# Patient Record
Sex: Male | Born: 1975 | Race: White | Hispanic: No | Marital: Married | Smoking: Never smoker
Health system: Southern US, Community
[De-identification: ages and names within clinical notes are randomized; demographics above are authoritative.]

## PROBLEM LIST (undated history)

## (undated) DIAGNOSIS — E669 Obesity, unspecified: Secondary | ICD-10-CM

## (undated) DIAGNOSIS — I10 Essential (primary) hypertension: Secondary | ICD-10-CM

## (undated) HISTORY — DX: Obesity, unspecified: E66.9

## (undated) HISTORY — PX: OTHER SURGICAL HISTORY: SHX169

## (undated) HISTORY — DX: Essential (primary) hypertension: I10

---

## 2001-02-13 ENCOUNTER — Emergency Department (HOSPITAL_COMMUNITY): Admission: EM | Admit: 2001-02-13 | Discharge: 2001-02-13 | Payer: Self-pay | Admitting: Emergency Medicine

## 2001-05-18 ENCOUNTER — Emergency Department (HOSPITAL_COMMUNITY): Admission: EM | Admit: 2001-05-18 | Discharge: 2001-05-18 | Payer: Self-pay | Admitting: Emergency Medicine

## 2014-01-14 ENCOUNTER — Other Ambulatory Visit: Payer: Self-pay

## 2014-01-25 ENCOUNTER — Telehealth: Payer: Self-pay | Admitting: Nurse Practitioner

## 2014-01-25 NOTE — Telephone Encounter (Signed)
Notified pt's wife that he needs to be seen appt scheduled

## 2014-01-30 ENCOUNTER — Ambulatory Visit: Payer: Self-pay | Admitting: Family

## 2014-02-13 ENCOUNTER — Ambulatory Visit: Payer: Self-pay | Admitting: Physician Assistant

## 2014-04-09 ENCOUNTER — Ambulatory Visit: Payer: Self-pay | Admitting: Family Medicine

## 2014-04-15 ENCOUNTER — Telehealth: Payer: Self-pay | Admitting: Family Medicine

## 2014-04-15 ENCOUNTER — Ambulatory Visit (INDEPENDENT_AMBULATORY_CARE_PROVIDER_SITE_OTHER): Payer: PRIVATE HEALTH INSURANCE | Admitting: Family Medicine

## 2014-04-15 ENCOUNTER — Encounter (INDEPENDENT_AMBULATORY_CARE_PROVIDER_SITE_OTHER): Payer: Self-pay

## 2014-04-15 VITALS — BP 181/103 | HR 80 | Temp 98.3°F | Ht 73.0 in | Wt 339.0 lb

## 2014-04-15 DIAGNOSIS — I10 Essential (primary) hypertension: Secondary | ICD-10-CM

## 2014-04-15 LAB — POCT CBC
Granulocyte percent: 67.3 %G (ref 37–80)
HCT, POC: 48.6 % (ref 43.5–53.7)
Hemoglobin: 16.5 g/dL (ref 14.1–18.1)
Lymph, poc: 1.9 (ref 0.6–3.4)
MCH, POC: 29.2 pg (ref 27–31.2)
MCHC: 33.9 g/dL (ref 31.8–35.4)
MCV: 86 fL (ref 80–97)
MPV: 7.9 fL (ref 0–99.8)
POC Granulocyte: 4.6 (ref 2–6.9)
POC LYMPH PERCENT: 28.3 %L (ref 10–50)
Platelet Count, POC: 147 10*3/uL (ref 142–424)
RBC: 5.7 M/uL (ref 4.69–6.13)
RDW, POC: 13.4 %
WBC: 6.8 10*3/uL (ref 4.6–10.2)

## 2014-04-15 MED ORDER — AMLODIPINE BESY-BENAZEPRIL HCL 10-40 MG PO CAPS: 1.0000 | ORAL_CAPSULE | Freq: Every day | ORAL | Status: DC

## 2014-04-16 LAB — THYROID PANEL WITH TSH
Free Thyroxine Index: 2 (ref 1.2–4.9)
T3 Uptake Ratio: 26 % (ref 24–39)
T4, Total: 7.7 ug/dL (ref 4.5–12.0)
TSH: 1.93 u[IU]/mL (ref 0.450–4.500)

## 2014-04-16 LAB — CMP14+EGFR
ALT: 29 IU/L (ref 0–44)
AST: 22 IU/L (ref 0–40)
Albumin/Globulin Ratio: 1.8 (ref 1.1–2.5)
Albumin: 4.6 g/dL (ref 3.5–5.5)
Alkaline Phosphatase: 93 IU/L (ref 39–117)
BUN/Creatinine Ratio: 14 (ref 8–19)
BUN: 18 mg/dL (ref 6–20)
CO2: 24 mmol/L (ref 18–29)
Calcium: 9.3 mg/dL (ref 8.7–10.2)
Chloride: 102 mmol/L (ref 97–108)
Creatinine, Ser: 1.27 mg/dL (ref 0.76–1.27)
GFR calc Af Amer: 82 mL/min/{1.73_m2} (ref >59)
GFR calc non Af Amer: 71 mL/min/{1.73_m2} (ref >59)
Globulin, Total: 2.5 g/dL (ref 1.5–4.5)
Glucose: 91 mg/dL (ref 65–99)
Potassium: 4.3 mmol/L (ref 3.5–5.2)
Sodium: 141 mmol/L (ref 134–144)
Total Bilirubin: 0.8 mg/dL (ref 0.0–1.2)
Total Protein: 7.1 g/dL (ref 6.0–8.5)

## 2014-04-16 LAB — LIPID PANEL
Chol/HDL Ratio: 5.5 ratio units — ABNORMAL HIGH (ref 0.0–5.0)
Cholesterol, Total: 166 mg/dL (ref 100–199)
HDL: 30 mg/dL — ABNORMAL LOW (ref >39)
Triglycerides: 402 mg/dL — ABNORMAL HIGH (ref 0–149)

## 2014-04-16 NOTE — Telephone Encounter (Signed)
done

## 2014-04-17 NOTE — Progress Notes (Signed)
   Subjective:    Patient ID: Marlos Carmen, male    DOB: 12-08-1975, 38 y.o.   MRN: [id number]  HPI This 38 y.o. male presents for evaluation of hypertension.  He has run out of his bp medicine and needs a refill.   Review of Systems No chest pain, SOB, HA, dizziness, vision change, N/V, diarrhea, constipation, dysuria, urinary urgency or frequency, myalgias, arthralgias or rash.     Objective:   Physical Exam  Vital signs noted  Well developed well nourished male.  HEENT - Head atraumatic Normocephalic                Eyes - PERRLA, Conjuctiva - clear Sclera- Clear EOMI                Ears - EAC's Wnl TM's Wnl Gross Hearing WNL                Nose - Nares patent                 Throat - oropharanx wnl Respiratory - Lungs CTA bilateral Cardiac - RRR S1 and S2 without murmur GI - Abdomen soft Nontender and bowel sounds active x 4 Extremities - No edema. Neuro - Grossly intact.      Assessment & Plan:  Essential hypertension, benign - Plan: amLODipine-benazepril (LOTREL) 10-40 MG per capsule, POCT CBC, CMP14+EGFR, Thyroid Panel With TSH, Lipid panel  Lysbeth Penner FNP

## 2014-05-16 ENCOUNTER — Ambulatory Visit (INDEPENDENT_AMBULATORY_CARE_PROVIDER_SITE_OTHER): Payer: PRIVATE HEALTH INSURANCE | Admitting: Family Medicine

## 2014-05-16 ENCOUNTER — Encounter: Payer: Self-pay | Admitting: Family Medicine

## 2014-05-16 VITALS — BP 132/80 | HR 61 | Temp 97.4°F | Ht 73.0 in | Wt 335.0 lb

## 2014-05-16 DIAGNOSIS — E785 Hyperlipidemia, unspecified: Secondary | ICD-10-CM

## 2014-05-16 DIAGNOSIS — I10 Essential (primary) hypertension: Secondary | ICD-10-CM

## 2014-05-16 NOTE — Progress Notes (Signed)
   Subjective:    Patient ID: Donald Eaton, male    DOB: 26-Apr-1976, 38 y.o.   MRN: [id number]  HPI Patient is here for follow up on his HTN.  He is tolerating his bp medicine.  He has elevated trigs on last lipid panel and is going to repeat fasting lipid panel.   Review of Systems    No chest pain, SOB, HA, dizziness, vision change, N/V, diarrhea, constipation, dysuria, urinary urgency or frequency, myalgias, arthralgias or rash.  Objective:   Physical Exam Vital signs noted  Well developed well nourished male.  HEENT - Head atraumatic Normocephalic                Eyes - PERRLA, Conjuctiva - clear Sclera- Clear EOMI                Ears - EAC's Wnl TM's Wnl Gross Hearing WNL                Throat - oropharanx wnl Respiratory - Lungs CTA bilateral Cardiac - RRR S1 and S2 without murmur GI - Abdomen soft Nontender and bowel sounds active x 4 Extremities - No edema. Neuro - Grossly intact.       Assessment & Plan:  Other and unspecified hyperlipidemia - Plan: Lipid panel  Essential hypertension, benign - Plan: BMP8+EGFR  Follow up in 6 months  Lysbeth Penner FNP

## 2014-05-17 LAB — BMP8+EGFR
BUN/Creatinine Ratio: 13 (ref 8–19)
BUN: 16 mg/dL (ref 6–20)
CO2: 24 mmol/L (ref 18–29)
Calcium: 9.5 mg/dL (ref 8.7–10.2)
Chloride: 102 mmol/L (ref 97–108)
Creatinine, Ser: 1.21 mg/dL (ref 0.76–1.27)
GFR calc Af Amer: 87 mL/min/{1.73_m2} (ref >59)
GFR calc non Af Amer: 75 mL/min/{1.73_m2} (ref >59)
Glucose: 94 mg/dL (ref 65–99)
Potassium: 4.9 mmol/L (ref 3.5–5.2)
Sodium: 139 mmol/L (ref 134–144)

## 2014-05-17 LAB — LIPID PANEL
Chol/HDL Ratio: 4.4 ratio units (ref 0.0–5.0)
Cholesterol, Total: 158 mg/dL (ref 100–199)
HDL: 36 mg/dL — ABNORMAL LOW (ref >39)
LDL Calculated: 89 mg/dL (ref 0–99)
Triglycerides: 163 mg/dL — ABNORMAL HIGH (ref 0–149)
VLDL Cholesterol Cal: 33 mg/dL (ref 5–40)

## 2014-08-19 ENCOUNTER — Ambulatory Visit: Payer: PRIVATE HEALTH INSURANCE | Admitting: Family Medicine

## 2015-04-14 ENCOUNTER — Ambulatory Visit (INDEPENDENT_AMBULATORY_CARE_PROVIDER_SITE_OTHER): Payer: PRIVATE HEALTH INSURANCE | Admitting: Nurse Practitioner

## 2015-04-14 ENCOUNTER — Encounter: Payer: Self-pay | Admitting: Nurse Practitioner

## 2015-04-14 VITALS — BP 143/89 | HR 75 | Temp 97.2°F | Ht 73.0 in | Wt 314.0 lb

## 2015-04-14 DIAGNOSIS — L918 Other hypertrophic disorders of the skin: Secondary | ICD-10-CM

## 2015-04-14 NOTE — Progress Notes (Signed)
   Subjective:    Patient ID: Donald Eaton, male    DOB: 16-Jan-1976, 39 y.o.   MRN: [id number]  HPI Patien tin c/o multiple skin tags in axillary area and on neck- wants removed.    Review of Systems  All other systems reviewed and are negative.      Objective:   Physical Exam  Constitutional: He appears well-developed and well-nourished.  Cardiovascular: Normal rate, regular rhythm and normal heart sounds.   Pulmonary/Chest: Effort normal and breath sounds normal.  Skin: Skin is warm.  Multiple skin tags left axillia and neck  Psychiatric: He has a normal mood and affect. His behavior is normal. Judgment and thought content normal.   BP 143/89 mmHg  Pulse 75  Temp(Src) 97.2 F (36.2 C) (Oral)  Ht  (1.854 m)  Wt 314 lb (142.429 kg)  BMI 41.44 kg/m2  Procedure:  Betadine prep  Stevens scissors and pick ups for removal- 45 total  Patient tolerated well.       Assessment & Plan:   1. Cutaneous skin tags    Watch for signs of infection RTO if want more removed  Mary-Margaret Daphine Deutscher, FNP

## 2015-10-27 ENCOUNTER — Ambulatory Visit (INDEPENDENT_AMBULATORY_CARE_PROVIDER_SITE_OTHER): Payer: PRIVATE HEALTH INSURANCE | Admitting: Family Medicine

## 2015-10-27 ENCOUNTER — Encounter: Payer: Self-pay | Admitting: Family Medicine

## 2015-10-27 ENCOUNTER — Encounter: Payer: Self-pay | Admitting: *Deleted

## 2015-10-27 VITALS — BP 174/105 | HR 67 | Temp 97.1°F | Ht 73.0 in | Wt 334.4 lb

## 2015-10-27 DIAGNOSIS — R519 Headache, unspecified: Secondary | ICD-10-CM

## 2015-10-27 DIAGNOSIS — R110 Nausea: Secondary | ICD-10-CM | POA: Diagnosis not present

## 2015-10-27 DIAGNOSIS — R51 Headache: Secondary | ICD-10-CM | POA: Diagnosis not present

## 2015-10-27 DIAGNOSIS — I10 Essential (primary) hypertension: Secondary | ICD-10-CM

## 2015-10-27 MED ORDER — AMLODIPINE BESY-BENAZEPRIL HCL 10-40 MG PO CAPS: 1.0000 | ORAL_CAPSULE | Freq: Every day | ORAL | Status: DC

## 2015-10-27 MED ORDER — ONDANSETRON 8 MG PO TBDP: 8.0000 mg | ORAL_TABLET | Freq: Four times a day (QID) | ORAL | Status: DC | PRN

## 2015-10-27 NOTE — Progress Notes (Signed)
Subjective:  Patient ID: Donald Eaton, male    DOB: 01/28/1976  Age: 40 y.o. MRN: [id number]  CC: Hypertension   HPI Donald DachMichael [place] presents for severe HA awakened him at 4 AM. Ermalene [place] aleve. Went back to bed. Awoke at 10 AM still had HA. Came to office. Pain now resolved at presentation. Remains nauseated. No vomiting. HA unusual for him. Located at left frontal to temple area. 7-8/10. Throbbing. Pt. Had lost weight so beginning on Jan 1 started decreasing frequency of BP med, lotrel. Last dose was 2-3 weeks ago. Took one this morning due to sx.    History Casimiro NeedleMichael has no past medical history on file.   He has no past surgical history on file.   His family history is not on file.He reports that he has never smoked. He does not have any smokeless tobacco history on file. He reports that he does not drink alcohol or use illicit drugs.    ROS Review of Systems  Constitutional: Negative for fever, chills, diaphoresis and unexpected weight change.  HENT: Negative for congestion, hearing loss, rhinorrhea and sore throat.   Eyes: Negative for visual disturbance.  Respiratory: Negative for cough and shortness of breath.   Cardiovascular: Negative for chest pain.  Gastrointestinal: Positive for nausea. Negative for vomiting, abdominal pain, diarrhea and constipation.  Genitourinary: Negative for dysuria and flank pain.  Musculoskeletal: Negative for joint swelling and arthralgias.  Skin: Negative for rash.  Neurological: Positive for headaches. Negative for dizziness.  Psychiatric/Behavioral: Negative for sleep disturbance and dysphoric mood.    Objective:  BP 174/105 mmHg  Pulse 67  Temp(Src) 97.1 F (36.2 C) (Oral)  Ht 6\' 1"  (1.854 m)  Wt 334 lb 6 oz (151.672 kg)  BMI 44.13 kg/m2  BP Readings from Last 3 Encounters:  10/27/15 174/105  04/14/15 143/89  05/16/14 132/80    Wt Readings from Last 3 Encounters:  10/27/15 334 lb 6 oz (151.672 kg)  04/14/15 314 lb (142.429  kg)  05/16/14 335 lb (151.955 kg)     Physical Exam  Constitutional: He is oriented to person, place, and time. He appears well-developed and well-nourished. No distress.  HENT:  Head: Normocephalic and atraumatic.  Right Ear: External ear normal.  Left Ear: External ear normal.  Nose: Nose normal.  Mouth/Throat: Oropharynx is clear and moist.  Eyes: Conjunctivae and EOM are normal. Pupils are equal, round, and reactive to light.  Neck: Normal range of motion. Neck supple. No thyromegaly present.  Cardiovascular: Normal rate, regular rhythm and normal heart sounds.   No murmur heard. Pulmonary/Chest: Effort normal and breath sounds normal. No respiratory distress. He has no wheezes. He has no rales.  Abdominal: Soft. Bowel sounds are normal. He exhibits no distension. There is no tenderness.  Lymphadenopathy:    He has no cervical adenopathy.  Neurological: He is alert and oriented to person, place, and time. He has normal reflexes. No cranial nerve deficit. He exhibits normal muscle tone. Coordination normal.  Skin: Skin is warm and dry.  Psychiatric: He has a normal mood and affect. His behavior is normal. Judgment and thought content normal.     Lab Results  Component Value Date   WBC 6.8 04/15/2014   HGB 16.5 04/15/2014   HCT 48.6 04/15/2014   GLUCOSE 94 05/16/2014   CHOL 158 05/16/2014   TRIG 163* 05/16/2014   HDL 36* 05/16/2014   LDLCALC 89 05/16/2014   ALT 29 04/15/2014   AST 22 04/15/2014   NA 139  05/16/2014   K 4.9 05/16/2014   CL 102 05/16/2014   CREATININE 1.21 05/16/2014   BUN 16 05/16/2014   CO2 24 05/16/2014   TSH 1.930 04/15/2014    No results found.  Assessment & Plan:   There are no diagnoses linked to this encounter.  Discussed [employer] Green low carb approach to weight loss  I am having Mr. Wacker maintain his amLODipine-benazepril.  No orders of the defined types were placed in this encounter.     Follow-up: No Follow-up on  file.  Mechele Claude, M.D.

## 2015-10-27 NOTE — Patient Instructions (Signed)
Try putting the "Lose It" app on your phone to help you with calorie counting, food choices and exercise

## 2015-10-28 LAB — CMP14+EGFR
ALBUMIN: 4.6 g/dL (ref 3.5–5.5)
ALT: 24 IU/L (ref 0–44)
AST: 22 IU/L (ref 0–40)
Albumin/Globulin Ratio: 1.7 (ref 1.1–2.5)
Alkaline Phosphatase: 59 IU/L (ref 39–117)
BUN / CREAT RATIO: 17 (ref 9–20)
BUN: 16 mg/dL (ref 6–24)
Bilirubin Total: 1 mg/dL (ref 0.0–1.2)
CALCIUM: 9.1 mg/dL (ref 8.7–10.2)
CO2: 20 mmol/L (ref 18–29)
CREATININE: 0.95 mg/dL (ref 0.76–1.27)
Chloride: 102 mmol/L (ref 96–106)
GFR, EST AFRICAN AMERICAN: 115 mL/min/{1.73_m2} (ref >59)
GFR, EST NON AFRICAN AMERICAN: 100 mL/min/{1.73_m2} (ref >59)
GLOBULIN, TOTAL: 2.7 g/dL (ref 1.5–4.5)
Glucose: 108 mg/dL — ABNORMAL HIGH (ref 65–99)
Potassium: 4.5 mmol/L (ref 3.5–5.2)
Sodium: 137 mmol/L (ref 134–144)
TOTAL PROTEIN: 7.3 g/dL (ref 6.0–8.5)

## 2015-10-28 LAB — CBC WITH DIFFERENTIAL/PLATELET
BASOS ABS: 0 10*3/uL (ref 0.0–0.2)
Basos: 0 %
EOS (ABSOLUTE): 0.1 10*3/uL (ref 0.0–0.4)
EOS: 1 %
HEMATOCRIT: 50.2 % (ref 37.5–51.0)
HEMOGLOBIN: 17.2 g/dL (ref 12.6–17.7)
IMMATURE GRANS (ABS): 0 10*3/uL (ref 0.0–0.1)
IMMATURE GRANULOCYTES: 0 %
LYMPHS ABS: 1.1 10*3/uL (ref 0.7–3.1)
LYMPHS: 19 %
MCH: 29 pg (ref 26.6–33.0)
MCHC: 34.3 g/dL (ref 31.5–35.7)
MCV: 85 fL (ref 79–97)
MONOCYTES: 6 %
Monocytes Absolute: 0.4 10*3/uL (ref 0.1–0.9)
Neutrophils Absolute: 4.4 10*3/uL (ref 1.4–7.0)
Neutrophils: 74 %
Platelets: 149 10*3/uL — ABNORMAL LOW (ref 150–379)
RBC: 5.94 x10E6/uL — AB (ref 4.14–5.80)
RDW: 13.6 % (ref 12.3–15.4)
WBC: 5.9 10*3/uL (ref 3.4–10.8)

## 2015-10-28 LAB — SEDIMENTATION RATE: Sed Rate: 2 mm/hr (ref 0–15)

## 2015-11-13 ENCOUNTER — Ambulatory Visit (INDEPENDENT_AMBULATORY_CARE_PROVIDER_SITE_OTHER): Payer: PRIVATE HEALTH INSURANCE | Admitting: Family Medicine

## 2015-11-13 VITALS — Temp 97.0°F | Ht 73.0 in | Wt 326.0 lb

## 2015-11-13 DIAGNOSIS — I10 Essential (primary) hypertension: Secondary | ICD-10-CM | POA: Diagnosis not present

## 2015-11-13 NOTE — Patient Instructions (Signed)
Congratulations on a great start on your weight loss plan! Keep going!

## 2015-11-13 NOTE — Progress Notes (Signed)
Subjective:  Patient ID: Donald Eaton, male    DOB: May 13, 1976  Age: 40 y.o. MRN: [id number]  CC: Hypertension   HPI Donald Eaton presents for  follow-up of hypertension. Patient has no history of headache chest pain or shortness of breath or recent cough. Patient also denies symptoms of TIA such as numbness weakness lateralizing. Patient denies side effects from medication. States taking it regularly. Working on [employer] and green diet. Weight down 8 lb in 18 days.   History [name] has no past medical history on file.   He has no past surgical history on file.   His family history is not on file.He reports that he has never smoked. He does not have any smokeless tobacco history on file. He reports that he does not drink alcohol or use illicit drugs.  Current Outpatient Prescriptions on File Prior to Visit  Medication Sig Dispense Refill  . amLODipine-benazepril (LOTREL) 10-40 MG capsule Take 1 capsule by mouth daily. 90 capsule 3  . ondansetron (ZOFRAN-ODT) 8 MG disintegrating tablet Take 1 tablet (8 mg total) by mouth every 6 (six) hours as needed for nausea or vomiting. (Patient not taking: Reported on 11/13/2015) 20 tablet 1   No current facility-administered medications on file prior to visit.    ROS Review of Systems  Constitutional: Negative for fever, chills and diaphoresis.  HENT: Negative for rhinorrhea and sore throat.   Respiratory: Negative for cough and shortness of breath.   Cardiovascular: Negative for chest pain.  Gastrointestinal: Negative for abdominal pain.  Musculoskeletal: Negative for myalgias and arthralgias.  Skin: Negative for rash.  Neurological: Negative for weakness and headaches.    Objective:  Temp(Src) 97 F (36.1 C) (Oral)  Ht 6' 1"  (1.854 m)  Wt 326 lb (147.873 kg)  BMI 43.02 kg/m2  BP Readings from Last 3 Encounters:  10/27/15 174/105  04/14/15 143/89  05/16/14 132/80    Wt Readings from Last 3 Encounters:  11/13/15 326 lb  (147.873 kg)  10/27/15 334 lb 6 oz (151.672 kg)  04/14/15 314 lb (142.429 kg)     Physical Exam  Constitutional: He is oriented to person, place, and time. He appears well-developed and well-nourished.  HENT:  Head: Normocephalic and atraumatic.  Right Ear: External ear normal.  Left Ear: External ear normal.  Mouth/Throat: No oropharyngeal exudate or posterior oropharyngeal erythema.  Eyes: Pupils are equal, round, and reactive to light.  Neck: Normal range of motion. Neck supple.  Cardiovascular: Normal rate and regular rhythm.   No murmur heard. Pulmonary/Chest: Breath sounds normal. No respiratory distress.  Abdominal: Bowel sounds are normal.  Genitourinary: Penis normal.  Neurological: He is alert and oriented to person, place, and time.  Skin: Skin is warm.  Psychiatric: He has a normal mood and affect.  Vitals reviewed.    Lab Results  Component Value Date   WBC 5.9 10/27/2015   HGB 16.5 04/15/2014   HCT 50.2 10/27/2015   PLT 149* 10/27/2015   GLUCOSE 108* 10/27/2015   CHOL 158 05/16/2014   TRIG 163* 05/16/2014   HDL 36* 05/16/2014   LDLCALC 89 05/16/2014   ALT 24 10/27/2015   AST 22 10/27/2015   NA 137 10/27/2015   K 4.5 10/27/2015   CL 102 10/27/2015   CREATININE 0.95 10/27/2015   BUN 16 10/27/2015   CO2 20 10/27/2015   TSH 1.930 04/15/2014    No results found.  Assessment & Plan:   Leslee was seen today for hypertension.  Diagnoses and all  orders for this visit:  Essential hypertension, benign -     CBC with Differential/Platelet -     CMP14+EGFR -     Lipid panel   I am having Mr. Barthelemy maintain his amLODipine-benazepril and ondansetron.  No orders of the defined types were placed in this encounter.    Congratulations on a great start on your weight loss plan! Keep going!  Follow-up: Return in about 6 months (around 05/15/2016) for hypertension, weight loss.  Claretta Fraise, M.D.

## 2015-11-14 LAB — CMP14+EGFR
ALT: 26 IU/L (ref 0–44)
AST: 25 IU/L (ref 0–40)
Albumin/Globulin Ratio: 1.7 (ref 1.2–2.2)
Albumin: 4.3 g/dL (ref 3.5–5.5)
Alkaline Phosphatase: 57 IU/L (ref 39–117)
BUN/Creatinine Ratio: 17 (ref 9–20)
BUN: 18 mg/dL (ref 6–24)
Bilirubin Total: 0.7 mg/dL (ref 0.0–1.2)
CALCIUM: 8.9 mg/dL (ref 8.7–10.2)
CO2: 21 mmol/L (ref 18–29)
CREATININE: 1.07 mg/dL (ref 0.76–1.27)
Chloride: 103 mmol/L (ref 96–106)
GFR, EST AFRICAN AMERICAN: 100 mL/min/{1.73_m2} (ref >59)
GFR, EST NON AFRICAN AMERICAN: 86 mL/min/{1.73_m2} (ref >59)
Globulin, Total: 2.5 g/dL (ref 1.5–4.5)
Glucose: 115 mg/dL — ABNORMAL HIGH (ref 65–99)
POTASSIUM: 4.8 mmol/L (ref 3.5–5.2)
Sodium: 139 mmol/L (ref 134–144)
TOTAL PROTEIN: 6.8 g/dL (ref 6.0–8.5)

## 2015-11-14 LAB — CBC WITH DIFFERENTIAL/PLATELET
BASOS ABS: 0 10*3/uL (ref 0.0–0.2)
Basos: 0 %
EOS (ABSOLUTE): 0.1 10*3/uL (ref 0.0–0.4)
Eos: 2 %
HEMATOCRIT: 46.9 % (ref 37.5–51.0)
Hemoglobin: 15.8 g/dL (ref 12.6–17.7)
IMMATURE GRANULOCYTES: 0 %
Immature Grans (Abs): 0 10*3/uL (ref 0.0–0.1)
LYMPHS: 39 %
Lymphocytes Absolute: 1.4 10*3/uL (ref 0.7–3.1)
MCH: 29 pg (ref 26.6–33.0)
MCHC: 33.7 g/dL (ref 31.5–35.7)
MCV: 86 fL (ref 79–97)
Monocytes Absolute: 0.4 10*3/uL (ref 0.1–0.9)
Monocytes: 11 %
NEUTROS PCT: 48 %
Neutrophils Absolute: 1.7 10*3/uL (ref 1.4–7.0)
PLATELETS: 153 10*3/uL (ref 150–379)
RBC: 5.44 x10E6/uL (ref 4.14–5.80)
RDW: 13.2 % (ref 12.3–15.4)
WBC: 3.6 10*3/uL (ref 3.4–10.8)

## 2015-11-14 LAB — LIPID PANEL
CHOL/HDL RATIO: 3.6 ratio (ref 0.0–5.0)
Cholesterol, Total: 152 mg/dL (ref 100–199)
HDL: 42 mg/dL (ref >39)
LDL CALC: 95 mg/dL (ref 0–99)
TRIGLYCERIDES: 75 mg/dL (ref 0–149)
VLDL CHOLESTEROL CAL: 15 mg/dL (ref 5–40)

## 2016-05-20 ENCOUNTER — Ambulatory Visit: Payer: PRIVATE HEALTH INSURANCE | Admitting: Family Medicine

## 2016-06-21 ENCOUNTER — Ambulatory Visit: Payer: PRIVATE HEALTH INSURANCE | Admitting: Family Medicine

## 2016-11-14 ENCOUNTER — Encounter: Payer: Self-pay | Admitting: Family Medicine

## 2016-11-14 ENCOUNTER — Ambulatory Visit (INDEPENDENT_AMBULATORY_CARE_PROVIDER_SITE_OTHER): Payer: PRIVATE HEALTH INSURANCE | Admitting: Family Medicine

## 2016-11-14 VITALS — BP 123/76 | HR 57 | Temp 97.1°F | Ht 73.0 in | Wt 317.0 lb

## 2016-11-14 DIAGNOSIS — I10 Essential (primary) hypertension: Secondary | ICD-10-CM

## 2016-11-14 NOTE — Progress Notes (Signed)
Subjective:  Patient ID: Donald Eaton, male    DOB: 13-Apr-1976  Age: 41 y.o. MRN: [id number]  CC: Hypertension (pt here today for routine follow up on his HTN, no other concerns voiced.)   HPI Donald Eaton presents for  follow-up of hypertension. Patient has no history of headache chest pain or shortness of breath or recent cough. Patient also denies symptoms of TIA such as numbness weakness lateralizing. Patient checks  blood pressure at home and has not had any elevated readings recently. Patient denies side effects from medication. States taking it regularly. Working on weight loss. Down 26 lb since his high wt. 3 mos ago. Using weights, walking. Limiting carbs to 20 gm / day. Increased greens and lean meats.  History Donald Eaton has no past medical history on file.   He has no past surgical history on file.   His family history is not on file.He reports that he has never smoked. He has never used smokeless tobacco. He reports that he does not drink alcohol or use drugs.  Current Outpatient Prescriptions on File Prior to Visit  Medication Sig Dispense Refill  . amLODipine-benazepril (LOTREL) 10-40 MG capsule Take 1 capsule by mouth daily. 90 capsule 3   No current facility-administered medications on file prior to visit.     ROS Review of Systems  Constitutional: Negative for chills, diaphoresis, fever and unexpected weight change.  HENT: Negative for congestion, hearing loss, rhinorrhea and sore throat.   Eyes: Negative for visual disturbance.  Respiratory: Negative for cough and shortness of breath.   Cardiovascular: Negative for chest pain.  Gastrointestinal: Negative for abdominal pain, constipation and diarrhea.  Genitourinary: Negative for dysuria and flank pain.  Musculoskeletal: Negative for arthralgias and joint swelling.  Skin: Negative for rash.  Neurological: Negative for dizziness and headaches.  Psychiatric/Behavioral: Negative for dysphoric mood and sleep  disturbance.    Objective:  BP (!) 150/84   Pulse (!) 57   Temp 97.1 F (36.2 C) (Oral)   Ht 6\' 1"  (1.854 m)   Wt (!) 317 lb (143.8 kg)   BMI 41.82 kg/m   BP Readings from Last 3 Encounters:  11/14/16 (!) 150/84  10/27/15 (!) 174/105  04/14/15 (!) 143/89    Wt Readings from Last 3 Encounters:  11/14/16 (!) 317 lb (143.8 kg)  11/13/15 (!) 326 lb (147.9 kg)  10/27/15 (!) 334 lb 6 oz (151.7 kg)     Physical Exam  Constitutional: He is oriented to person, place, and time. He appears well-developed and well-nourished. No distress.  HENT:  Head: Normocephalic and atraumatic.  Right Ear: External ear normal.  Left Ear: External ear normal.  Nose: Nose normal.  Mouth/Throat: Oropharynx is clear and moist.  Eyes: Conjunctivae and EOM are normal. Pupils are equal, round, and reactive to light.  Neck: Normal range of motion. Neck supple. No thyromegaly present.  Cardiovascular: Normal rate, regular rhythm and normal heart sounds.   No murmur heard. Pulmonary/Chest: Effort normal and breath sounds normal. No respiratory distress. He has no wheezes. He has no rales.  Abdominal: Soft. Bowel sounds are normal. He exhibits no distension. There is no tenderness.  Lymphadenopathy:    He has no cervical adenopathy.  Neurological: He is alert and oriented to person, place, and time. He has normal reflexes.  Skin: Skin is warm and dry.  Psychiatric: He has a normal mood and affect. His behavior is normal. Judgment and thought content normal.     Lab Results  Component Value  Date   WBC 3.6 11/13/2015   HGB 16.5 04/15/2014   HCT 46.9 11/13/2015   PLT 153 11/13/2015   GLUCOSE 115 (H) 11/13/2015   CHOL 152 11/13/2015   TRIG 75 11/13/2015   HDL 42 11/13/2015   LDLCALC 95 11/13/2015   ALT 26 11/13/2015   AST 25 11/13/2015   NA 139 11/13/2015   K 4.8 11/13/2015   CL 103 11/13/2015   CREATININE 1.07 11/13/2015   BUN 18 11/13/2015   CO2 21 11/13/2015   TSH 1.930 04/15/2014     No results found.  Assessment & Plan:   Donald NeedleMichael was seen today for hypertension.  Diagnoses and all orders for this visit:  Essential hypertension, benign   I have discontinued Mr. Shular's ondansetron. I am also having him maintain his amLODipine-benazepril.  No orders of the defined types were placed in this encounter.     Follow-up: Return in about 6 months (around 05/17/2017) for CPE, Wellness.  Mechele ClaudeWarren Gianni Mihalik, M.D.

## 2016-11-14 NOTE — Addendum Note (Signed)
Addended by: Margurite AuerbachOMPTON, KARLA G on: 11/14/2016 04:54 PM   Modules accepted: Orders

## 2016-11-15 ENCOUNTER — Other Ambulatory Visit: Payer: Self-pay | Admitting: *Deleted

## 2016-11-15 DIAGNOSIS — I10 Essential (primary) hypertension: Secondary | ICD-10-CM

## 2016-11-15 LAB — CBC WITH DIFFERENTIAL/PLATELET
BASOS: 0 %
Basophils Absolute: 0 10*3/uL (ref 0.0–0.2)
EOS (ABSOLUTE): 0.1 10*3/uL (ref 0.0–0.4)
Eos: 2 %
Hematocrit: 47.9 % (ref 37.5–51.0)
Hemoglobin: 15.6 g/dL (ref 13.0–17.7)
IMMATURE GRANULOCYTES: 0 %
Immature Grans (Abs): 0 10*3/uL (ref 0.0–0.1)
LYMPHS ABS: 1.5 10*3/uL (ref 0.7–3.1)
Lymphs: 39 %
MCH: 28.8 pg (ref 26.6–33.0)
MCHC: 32.6 g/dL (ref 31.5–35.7)
MCV: 89 fL (ref 79–97)
MONOS ABS: 0.4 10*3/uL (ref 0.1–0.9)
Monocytes: 10 %
NEUTROS PCT: 49 %
Neutrophils Absolute: 1.8 10*3/uL (ref 1.4–7.0)
PLATELETS: 135 10*3/uL — AB (ref 150–379)
RBC: 5.41 x10E6/uL (ref 4.14–5.80)
RDW: 13.4 % (ref 12.3–15.4)
WBC: 3.7 10*3/uL (ref 3.4–10.8)

## 2016-11-15 LAB — CMP14+EGFR
ALK PHOS: 65 IU/L (ref 39–117)
ALT: 18 IU/L (ref 0–44)
AST: 17 IU/L (ref 0–40)
Albumin/Globulin Ratio: 1.9 (ref 1.2–2.2)
Albumin: 4.7 g/dL (ref 3.5–5.5)
BILIRUBIN TOTAL: 0.8 mg/dL (ref 0.0–1.2)
BUN / CREAT RATIO: 13 (ref 9–20)
BUN: 16 mg/dL (ref 6–24)
CALCIUM: 9.2 mg/dL (ref 8.7–10.2)
CHLORIDE: 102 mmol/L (ref 96–106)
CO2: 22 mmol/L (ref 18–29)
Creatinine, Ser: 1.24 mg/dL (ref 0.76–1.27)
GFR calc Af Amer: 83 mL/min/{1.73_m2} (ref >59)
GFR calc non Af Amer: 72 mL/min/{1.73_m2} (ref >59)
Globulin, Total: 2.5 g/dL (ref 1.5–4.5)
Glucose: 92 mg/dL (ref 65–99)
Potassium: 4.7 mmol/L (ref 3.5–5.2)
Sodium: 142 mmol/L (ref 134–144)
Total Protein: 7.2 g/dL (ref 6.0–8.5)

## 2016-11-15 LAB — LIPID PANEL
CHOLESTEROL TOTAL: 179 mg/dL (ref 100–199)
Chol/HDL Ratio: 4.2 ratio units (ref 0.0–5.0)
HDL: 43 mg/dL (ref >39)
LDL Calculated: 118 mg/dL — ABNORMAL HIGH (ref 0–99)
Triglycerides: 88 mg/dL (ref 0–149)
VLDL CHOLESTEROL CAL: 18 mg/dL (ref 5–40)

## 2016-11-15 MED ORDER — AMLODIPINE BESY-BENAZEPRIL HCL 10-40 MG PO CAPS: 1.0000 | ORAL_CAPSULE | Freq: Every day | ORAL | 3 refills | Status: DC

## 2017-05-19 ENCOUNTER — Ambulatory Visit (INDEPENDENT_AMBULATORY_CARE_PROVIDER_SITE_OTHER): Payer: PRIVATE HEALTH INSURANCE | Admitting: Family Medicine

## 2017-05-19 ENCOUNTER — Encounter: Payer: Self-pay | Admitting: Family Medicine

## 2017-05-19 VITALS — BP 119/74 | HR 62 | Ht 73.0 in | Wt 284.0 lb

## 2017-05-19 DIAGNOSIS — Z0000 Encounter for general adult medical examination without abnormal findings: Secondary | ICD-10-CM | POA: Diagnosis not present

## 2017-05-19 DIAGNOSIS — I10 Essential (primary) hypertension: Secondary | ICD-10-CM

## 2017-05-19 DIAGNOSIS — L719 Rosacea, unspecified: Secondary | ICD-10-CM | POA: Insufficient documentation

## 2017-05-19 LAB — URINALYSIS
Bilirubin, UA: NEGATIVE
Glucose, UA: NEGATIVE
Ketones, UA: NEGATIVE
LEUKOCYTES UA: NEGATIVE
NITRITE UA: NEGATIVE
PH UA: 7.5 (ref 5.0–7.5)
Protein, UA: NEGATIVE
RBC, UA: NEGATIVE
Specific Gravity, UA: 1.01 (ref 1.005–1.030)
UUROB: 0.2 mg/dL (ref 0.2–1.0)

## 2017-05-19 MED ORDER — AMLODIPINE BESY-BENAZEPRIL HCL 10-40 MG PO CAPS: 1.0000 | ORAL_CAPSULE | Freq: Every day | ORAL | 3 refills | Status: DC

## 2017-05-19 MED ORDER — METRONIDAZOLE 1 % EX GEL: Freq: Every day | CUTANEOUS | 11 refills | Status: DC

## 2017-05-19 NOTE — Progress Notes (Signed)
Subjective:  Patient ID: Donald Eaton, male    DOB: [birth year]  Age: 41 y.o. MRN: [id number]  CC: Annual Exam (pt here today for CPE)   HPI Donald Eaton presents for Annual exam and  follow-up of hypertension. Patient has no history of headache chest pain or shortness of breath or recent cough. Patient .also denies symptoms of TIA such as focal numbness or weakness.  Patient denies side effects from medication. States taking it regularly. Pt. On diet, has lost 60 lbs over 1 year   History Donald Eaton has a past medical history of Hypertension and Obesity.   He has a past surgical history that includes none.   His family history includes Heart disease in his father.He reports that he has never smoked. He has never used smokeless tobacco. He reports that he does not drink alcohol or use drugs.  No current outpatient prescriptions on file prior to visit.   No current facility-administered medications on file prior to visit.     ROS Review of Systems  Constitutional: Negative for activity change, appetite change, chills, diaphoresis, fatigue, fever and unexpected weight change.  HENT: Negative for congestion, ear pain, hearing loss, postnasal drip, rhinorrhea, sore throat, tinnitus and trouble swallowing.   Eyes: Negative for photophobia, pain, discharge and redness.  Respiratory: Negative for apnea, cough, choking, chest tightness, shortness of breath, wheezing and stridor.   Cardiovascular: Negative for chest pain, palpitations and leg swelling.  Gastrointestinal: Negative for abdominal distention, abdominal pain, blood in stool, constipation, diarrhea, nausea and vomiting.  Endocrine: Negative for cold intolerance, heat intolerance, polydipsia, polyphagia and polyuria.  Genitourinary: Negative for difficulty urinating, dysuria, enuresis, flank pain, frequency, genital sores, hematuria and urgency.  Musculoskeletal: Negative for arthralgias and joint swelling.  Skin: Negative for  color change, rash and wound.  Allergic/Immunologic: Negative for immunocompromised state.  Neurological: Negative for dizziness, tremors, seizures, syncope, facial asymmetry, speech difficulty, weakness, light-headedness, numbness and headaches.  Hematological: Does not bruise/bleed easily.  Psychiatric/Behavioral: Negative for agitation, behavioral problems, confusion, decreased concentration, dysphoric mood, hallucinations, sleep disturbance and suicidal ideas. The patient is not nervous/anxious and is not hyperactive.     Objective:  BP 119/74   Pulse 62   Ht 6' 1"  (1.854 m)   Wt 284 lb (128.8 kg)   BMI 37.47 kg/m   BP Readings from Last 3 Encounters:  05/19/17 119/74  11/14/16 123/76  10/27/15 (!) 174/105    Wt Readings from Last 3 Encounters:  05/19/17 284 lb (128.8 kg)  11/14/16 (!) 317 lb (143.8 kg)  11/13/15 (!) 326 lb (147.9 kg)     Physical Exam  Constitutional: He is oriented to person, place, and time. He appears well-developed and well-nourished.  HENT:  Head: Normocephalic and atraumatic.  Mouth/Throat: Oropharynx is clear and moist.  Eyes: Pupils are equal, round, and reactive to light. EOM are normal.  Neck: Normal range of motion. No tracheal deviation present. No thyromegaly present.  Cardiovascular: Normal rate, regular rhythm and normal heart sounds.  Exam reveals no gallop and no friction rub.   No murmur heard. Pulmonary/Chest: Breath sounds normal. He has no wheezes. He has no rales.  Abdominal: Soft. He exhibits no mass. There is no tenderness.  Musculoskeletal: Normal range of motion. He exhibits no edema.  Neurological: He is alert and oriented to person, place, and time.  Skin: Skin is warm and dry. Rash (mulopapular erythema of cheeks, nose. Few telangectasias) noted.  Psychiatric: He has a normal mood and affect.  Assessment & Plan:   Donald Eaton was seen today for annual exam.  Diagnoses and all orders for this visit:  Well adult  exam -     CBC with Differential/Platelet -     CMP14+EGFR -     Lipid panel -     PSA, total and free -     Urinalysis  Essential hypertension, benign -     CMP14+EGFR -     Urinalysis -     amLODipine-benazepril (LOTREL) 10-40 MG capsule; Take 1 capsule by mouth daily.  Rosacea, acne -     metroNIDAZOLE (METROGEL) 1 % gel; Apply topically daily.   Allergies as of 05/19/2017      Reactions   Niaspan [niacin Er] Rash      Medication List       Accurate as of 05/19/17 10:15 PM. Always use your most recent med list.          amLODipine-benazepril 10-40 MG capsule Commonly known as:  LOTREL Take 1 capsule by mouth daily.   metroNIDAZOLE 1 % gel Commonly known as:  METROGEL Apply topically daily.            Discharge Care Instructions        Start     Ordered   05/19/17 0000  CBC with Differential/Platelet     05/19/17 1007   05/19/17 0000  CMP14+EGFR     05/19/17 1007   05/19/17 0000  Lipid panel     05/19/17 1007   05/19/17 0000  PSA, total and free     05/19/17 1007   05/19/17 0000  Urinalysis     05/19/17 1007   05/19/17 0000  metroNIDAZOLE (METROGEL) 1 % gel  Daily     05/19/17 1008   05/19/17 0000  amLODipine-benazepril (LOTREL) 10-40 MG capsule  Daily     05/19/17 1008      Meds ordered this encounter  Medications  . metroNIDAZOLE (METROGEL) 1 % gel    Sig: Apply topically daily.    Dispense:  60 g    Refill:  11  . amLODipine-benazepril (LOTREL) 10-40 MG capsule    Sig: Take 1 capsule by mouth daily.    Dispense:  90 capsule    Refill:  3      Follow-up: Return in about 6 months (around 11/16/2017).  Claretta Fraise, M.D.

## 2017-05-19 NOTE — Patient Instructions (Signed)
Wash face daily with Neutrogena "orange" acne wash.

## 2017-05-20 LAB — LIPID PANEL
CHOL/HDL RATIO: 3.7 ratio (ref 0.0–5.0)
Cholesterol, Total: 174 mg/dL (ref 100–199)
HDL: 47 mg/dL (ref >39)
LDL Calculated: 113 mg/dL — ABNORMAL HIGH (ref 0–99)
Triglycerides: 72 mg/dL (ref 0–149)
VLDL Cholesterol Cal: 14 mg/dL (ref 5–40)

## 2017-05-20 LAB — CBC WITH DIFFERENTIAL/PLATELET
Basophils Absolute: 0 10*3/uL (ref 0.0–0.2)
Basos: 0 %
EOS (ABSOLUTE): 0 10*3/uL (ref 0.0–0.4)
Eos: 1 %
Hematocrit: 43.7 % (ref 37.5–51.0)
Hemoglobin: 14.8 g/dL (ref 13.0–17.7)
IMMATURE GRANULOCYTES: 1 %
Immature Grans (Abs): 0 10*3/uL (ref 0.0–0.1)
Lymphocytes Absolute: 1.6 10*3/uL (ref 0.7–3.1)
Lymphs: 37 %
MCH: 29 pg (ref 26.6–33.0)
MCHC: 33.9 g/dL (ref 31.5–35.7)
MCV: 86 fL (ref 79–97)
MONOS ABS: 0.3 10*3/uL (ref 0.1–0.9)
Monocytes: 8 %
NEUTROS PCT: 53 %
Neutrophils Absolute: 2.3 10*3/uL (ref 1.4–7.0)
PLATELETS: 162 10*3/uL (ref 150–379)
RBC: 5.11 x10E6/uL (ref 4.14–5.80)
RDW: 13.6 % (ref 12.3–15.4)
WBC: 4.3 10*3/uL (ref 3.4–10.8)

## 2017-05-20 LAB — PSA, TOTAL AND FREE
PROSTATE SPECIFIC AG, SERUM: 0.4 ng/mL (ref 0.0–4.0)
PSA FREE PCT: 45 %
PSA, Free: 0.18 ng/mL

## 2017-05-20 LAB — CMP14+EGFR
A/G RATIO: 1.8 (ref 1.2–2.2)
ALT: 13 IU/L (ref 0–44)
AST: 15 IU/L (ref 0–40)
Albumin: 4.4 g/dL (ref 3.5–5.5)
Alkaline Phosphatase: 56 IU/L (ref 39–117)
BILIRUBIN TOTAL: 1 mg/dL (ref 0.0–1.2)
BUN/Creatinine Ratio: 18 (ref 9–20)
BUN: 20 mg/dL (ref 6–24)
CHLORIDE: 102 mmol/L (ref 96–106)
CO2: 24 mmol/L (ref 20–29)
Calcium: 9.2 mg/dL (ref 8.7–10.2)
Creatinine, Ser: 1.12 mg/dL (ref 0.76–1.27)
GFR, EST AFRICAN AMERICAN: 94 mL/min/{1.73_m2} (ref >59)
GFR, EST NON AFRICAN AMERICAN: 81 mL/min/{1.73_m2} (ref >59)
GLOBULIN, TOTAL: 2.4 g/dL (ref 1.5–4.5)
Glucose: 88 mg/dL (ref 65–99)
POTASSIUM: 4.6 mmol/L (ref 3.5–5.2)
SODIUM: 142 mmol/L (ref 134–144)
TOTAL PROTEIN: 6.8 g/dL (ref 6.0–8.5)

## 2018-05-28 ENCOUNTER — Ambulatory Visit (INDEPENDENT_AMBULATORY_CARE_PROVIDER_SITE_OTHER): Payer: PRIVATE HEALTH INSURANCE | Admitting: Family Medicine

## 2018-05-28 ENCOUNTER — Ambulatory Visit (INDEPENDENT_AMBULATORY_CARE_PROVIDER_SITE_OTHER): Payer: PRIVATE HEALTH INSURANCE

## 2018-05-28 ENCOUNTER — Encounter: Payer: Self-pay | Admitting: Family Medicine

## 2018-05-28 VITALS — BP 132/87 | HR 63 | Temp 97.6°F | Ht 73.0 in | Wt 303.8 lb

## 2018-05-28 DIAGNOSIS — M25572 Pain in left ankle and joints of left foot: Secondary | ICD-10-CM

## 2018-05-28 DIAGNOSIS — Z0000 Encounter for general adult medical examination without abnormal findings: Secondary | ICD-10-CM | POA: Diagnosis not present

## 2018-05-28 DIAGNOSIS — Z125 Encounter for screening for malignant neoplasm of prostate: Secondary | ICD-10-CM

## 2018-05-28 DIAGNOSIS — L719 Rosacea, unspecified: Secondary | ICD-10-CM | POA: Diagnosis not present

## 2018-05-28 DIAGNOSIS — Z23 Encounter for immunization: Secondary | ICD-10-CM

## 2018-05-28 DIAGNOSIS — I10 Essential (primary) hypertension: Secondary | ICD-10-CM

## 2018-05-28 LAB — URINALYSIS
Bilirubin, UA: NEGATIVE
Glucose, UA: NEGATIVE
KETONES UA: NEGATIVE
Leukocytes, UA: NEGATIVE
NITRITE UA: NEGATIVE
PH UA: 6 (ref 5.0–7.5)
Protein, UA: NEGATIVE
Specific Gravity, UA: 1.015 (ref 1.005–1.030)
UUROB: 0.2 mg/dL (ref 0.2–1.0)

## 2018-05-28 MED ORDER — METRONIDAZOLE 1 % EX GEL: Freq: Every day | CUTANEOUS | 11 refills | Status: DC

## 2018-05-28 MED ORDER — AMLODIPINE BESY-BENAZEPRIL HCL 10-40 MG PO CAPS
1.0000 | ORAL_CAPSULE | Freq: Every day | ORAL | 3 refills | Status: DC
Start: 2018-05-28 — End: 2019-06-11

## 2018-05-28 NOTE — Progress Notes (Signed)
Subjective:  Patient ID: Donald Eaton, male    DOB: [birth year]  Age: 42 y.o. MRN: [id number]  CC: Annual Exam   HPI [name] presents for complete physical examination.  He has no concerns regarding health maintenance.  His review of systems was essentially normal the exception to this is that he stepped in a hole and twisted his ankle a couple of days ago on May 26, 2018.  He is now having pain but he can walk okay on the left ankle.  He did experience some bruising.  And the pain is noted to be on the lateral aspect of the left ankle.  Follow-up of hypertension. Patient has no history of headache chest pain or shortness of breath or recent cough. Patient also denies symptoms of TIA such as numbness weakness lateralizing. Patient checks  blood pressure at home and has not had any elevated readings recently. Patient denies side effects from his medication. States taking it regularly.   Depression screen [facility] 2/9 05/28/2018 05/19/2017 11/14/2016  Decreased Interest 0 0 0  Down, Depressed, Hopeless 0 0 0  PHQ - 2 Score 0 0 0    History Donald Eaton has a past medical history of Hypertension and Obesity.   He has a past surgical history that includes none.   His family history includes Heart disease in his father.He reports that he has never smoked. He has never used smokeless tobacco. He reports that he does not drink alcohol or use drugs.    ROS Review of Systems  Constitutional: Negative for activity change, fatigue and unexpected weight change.  HENT: Negative for congestion, ear pain, hearing loss, postnasal drip and trouble swallowing.   Eyes: Negative for pain and visual disturbance.  Respiratory: Negative for cough, chest tightness and shortness of breath.   Cardiovascular: Negative for chest pain, palpitations and leg swelling.  Gastrointestinal: Negative for abdominal distention, abdominal pain, blood in stool, constipation, diarrhea, nausea and vomiting.  Endocrine:  Negative for cold intolerance, heat intolerance and polydipsia.  Genitourinary: Negative for difficulty urinating, dysuria, flank pain, frequency and urgency.  Musculoskeletal: Negative for arthralgias and joint swelling.  Skin: Negative for color change, rash and wound.  Neurological: Negative for dizziness, syncope, speech difficulty, weakness, light-headedness, numbness and headaches.  Hematological: Does not bruise/bleed easily.  Psychiatric/Behavioral: Negative for confusion, decreased concentration, dysphoric mood and sleep disturbance. The patient is not nervous/anxious.     Objective:  BP 132/87   Pulse 63   Temp 97.6 F (36.4 C) (Oral)   Ht 6' 1"  (1.854 m)   Wt (!) 303 lb 12.8 oz (137.8 kg)   BMI 40.08 kg/m   BP Readings from Last 3 Encounters:  05/28/18 132/87  05/19/17 119/74  11/14/16 123/76    Wt Readings from Last 3 Encounters:  05/28/18 (!) 303 lb 12.8 oz (137.8 kg)  05/19/17 284 lb (128.8 kg)  11/14/16 (!) 317 lb (143.8 kg)     Physical Exam  Constitutional: He is oriented to person, place, and time. He appears well-developed and well-nourished.  HENT:  Head: Normocephalic and atraumatic.  Mouth/Throat: Oropharynx is clear and moist.  Eyes: Pupils are equal, round, and reactive to light. EOM are normal.  Neck: Normal range of motion. No tracheal deviation present. No thyromegaly present.  Cardiovascular: Normal rate, regular rhythm and normal heart sounds. Exam reveals no gallop and no friction rub.  No murmur heard. Pulmonary/Chest: Breath sounds normal. He has no wheezes. He has no rales.  Abdominal: Soft. Bowel sounds  are normal. He exhibits no distension and no mass. There is no tenderness. Hernia confirmed negative in the right inguinal area and confirmed negative in the left inguinal area.  Genitourinary: Testes normal and penis normal.  Musculoskeletal: Normal range of motion. He exhibits tenderness (Lateral aspect of left ankle with significant  hematoma formation in the region.  There is stability for eversion inversion and drawer sign). He exhibits no edema.  Lymphadenopathy:    He has no cervical adenopathy.  Neurological: He is alert and oriented to person, place, and time.  Skin: Skin is warm and dry.  Psychiatric: He has a normal mood and affect.   X-ray: Preliminary review of the left ankle is negative for any bony abnormality.   Assessment & Plan:   Nazeer was seen today for annual exam.  Diagnoses and all orders for this visit:  Well adult exam  Rosacea, acne -     metroNIDAZOLE (METROGEL) 1 % gel; Apply topically daily. -     CBC with Differential/Platelet -     CMP14+EGFR -     Lipid panel -     Urinalysis -     VITAMIN D 25 Hydroxy (Vit-D Deficiency, Fractures)  Essential hypertension, benign -     amLODipine-benazepril (LOTREL) 10-40 MG capsule; Take 1 capsule by mouth daily. -     CBC with Differential/Platelet -     CMP14+EGFR -     Lipid panel -     Urinalysis -     VITAMIN D 25 Hydroxy (Vit-D Deficiency, Fractures)  Encounter for immunization -     Cancel: Flu vaccine HIGH DOSE PF  Prostate cancer screening -     CBC with Differential/Platelet -     CMP14+EGFR -     PSA, total and free -     Lipid panel -     Urinalysis -     VITAMIN D 25 Hydroxy (Vit-D Deficiency, Fractures)  Acute left ankle pain -     DG Ankle Complete Left; Future  An ASO brace for the left ankle was dispensed and fitted.  Patient was advised to wear this at all times except while bathing for the next 2 weeks and periodically after that time as needed.  If symptoms have not resolved in 2 weeks will refer him to orthopedics.     I am having Jaxn Chiquito maintain his metroNIDAZOLE and amLODipine-benazepril.  Allergies as of 05/28/2018      Reactions   Niaspan [niacin Er] Rash      Medication List        Accurate as of 05/28/18 12:28 PM. Always use your most recent med list.            amLODipine-benazepril 10-40 MG capsule Commonly known as:  LOTREL Take 1 capsule by mouth daily.   metroNIDAZOLE 1 % gel Commonly known as:  METROGEL Apply topically daily.        Follow-up: Return in about 6 months (around 11/27/2018).  Claretta Fraise, M.D.

## 2018-05-29 LAB — CMP14+EGFR
A/G RATIO: 1.8 (ref 1.2–2.2)
ALK PHOS: 70 IU/L (ref 39–117)
ALT: 15 IU/L (ref 0–44)
AST: 21 IU/L (ref 0–40)
Albumin: 4.6 g/dL (ref 3.5–5.5)
BILIRUBIN TOTAL: 0.9 mg/dL (ref 0.0–1.2)
BUN / CREAT RATIO: 12 (ref 9–20)
BUN: 13 mg/dL (ref 6–24)
CHLORIDE: 103 mmol/L (ref 96–106)
CO2: 23 mmol/L (ref 20–29)
Calcium: 9.3 mg/dL (ref 8.7–10.2)
Creatinine, Ser: 1.05 mg/dL (ref 0.76–1.27)
GFR calc Af Amer: 101 mL/min/{1.73_m2} (ref >59)
GFR calc non Af Amer: 87 mL/min/{1.73_m2} (ref >59)
GLOBULIN, TOTAL: 2.5 g/dL (ref 1.5–4.5)
GLUCOSE: 88 mg/dL (ref 65–99)
POTASSIUM: 4.6 mmol/L (ref 3.5–5.2)
SODIUM: 142 mmol/L (ref 134–144)
Total Protein: 7.1 g/dL (ref 6.0–8.5)

## 2018-05-29 LAB — CBC WITH DIFFERENTIAL/PLATELET
BASOS ABS: 0 10*3/uL (ref 0.0–0.2)
Basos: 0 %
EOS (ABSOLUTE): 0.1 10*3/uL (ref 0.0–0.4)
Eos: 1 %
HEMATOCRIT: 46.3 % (ref 37.5–51.0)
Hemoglobin: 15.8 g/dL (ref 13.0–17.7)
Immature Grans (Abs): 0 10*3/uL (ref 0.0–0.1)
Immature Granulocytes: 0 %
LYMPHS: 35 %
Lymphocytes Absolute: 1.7 10*3/uL (ref 0.7–3.1)
MCH: 29.9 pg (ref 26.6–33.0)
MCHC: 34.1 g/dL (ref 31.5–35.7)
MCV: 88 fL (ref 79–97)
Monocytes Absolute: 0.5 10*3/uL (ref 0.1–0.9)
Monocytes: 10 %
NEUTROS ABS: 2.6 10*3/uL (ref 1.4–7.0)
Neutrophils: 54 %
Platelets: 166 10*3/uL (ref 150–450)
RBC: 5.29 x10E6/uL (ref 4.14–5.80)
RDW: 12.6 % (ref 12.3–15.4)
WBC: 4.9 10*3/uL (ref 3.4–10.8)

## 2018-05-29 LAB — PSA, TOTAL AND FREE
PROSTATE SPECIFIC AG, SERUM: 0.3 ng/mL (ref 0.0–4.0)
PSA FREE PCT: 60 %
PSA, Free: 0.18 ng/mL

## 2018-05-29 LAB — VITAMIN D 25 HYDROXY (VIT D DEFICIENCY, FRACTURES): VIT D 25 HYDROXY: 27.2 ng/mL — AB (ref 30.0–100.0)

## 2018-05-29 LAB — LIPID PANEL
Chol/HDL Ratio: 3.9 ratio (ref 0.0–5.0)
Cholesterol, Total: 181 mg/dL (ref 100–199)
HDL: 46 mg/dL (ref >39)
LDL CALC: 113 mg/dL — AB (ref 0–99)
Triglycerides: 109 mg/dL (ref 0–149)
VLDL CHOLESTEROL CAL: 22 mg/dL (ref 5–40)

## 2018-06-07 ENCOUNTER — Other Ambulatory Visit: Payer: Self-pay

## 2018-06-07 MED ORDER — VITAMIN D (ERGOCALCIFEROL) 1.25 MG (50000 UNIT) PO CAPS: 50000.0000 [IU] | ORAL_CAPSULE | ORAL | 1 refills | Status: DC

## 2019-05-28 IMAGING — DX DG ANKLE COMPLETE 3+V*L*
3 series · 3 of 3 positions shown · non-contrast
Comparison: None

CLINICAL DATA: Twisted ankle on [REDACTED], acute LEFT ankle pain

EXAM:
LEFT ANKLE COMPLETE - 3+ VIEW

[ankle ap]
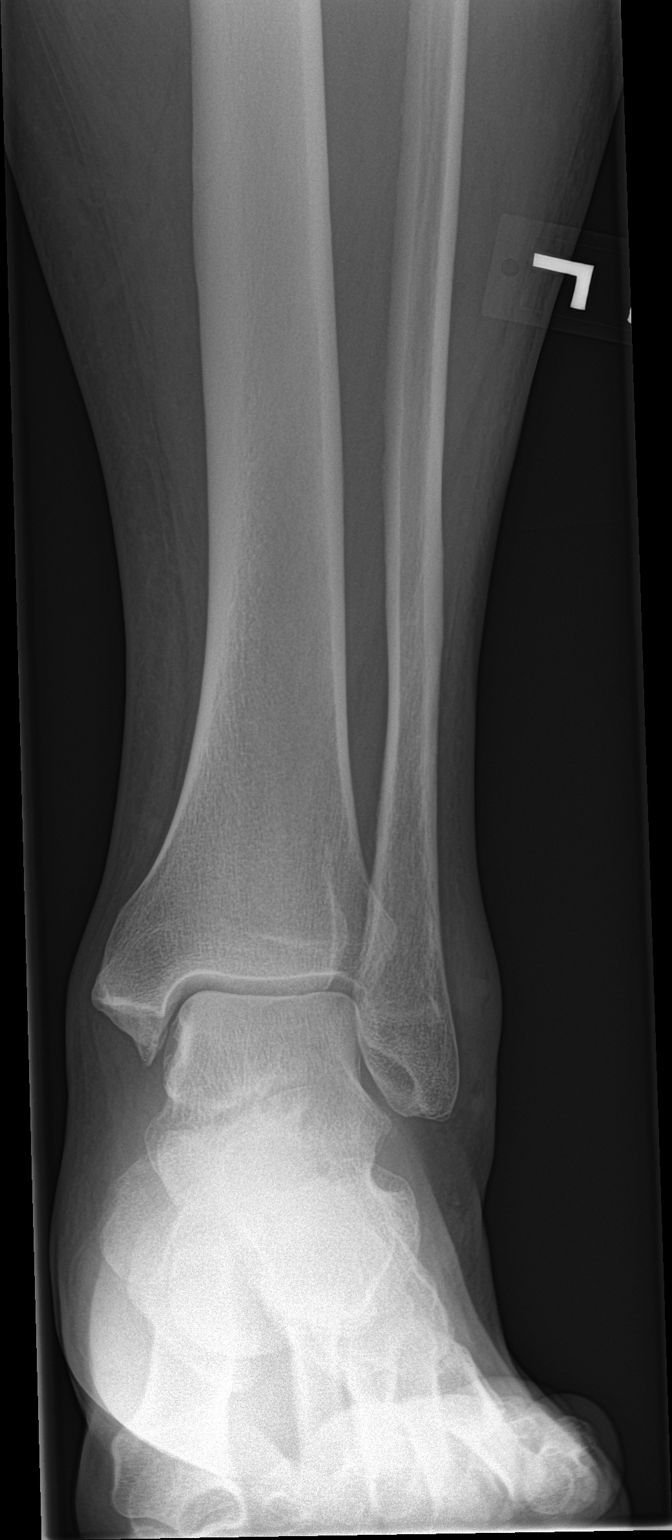

[ankle obl]
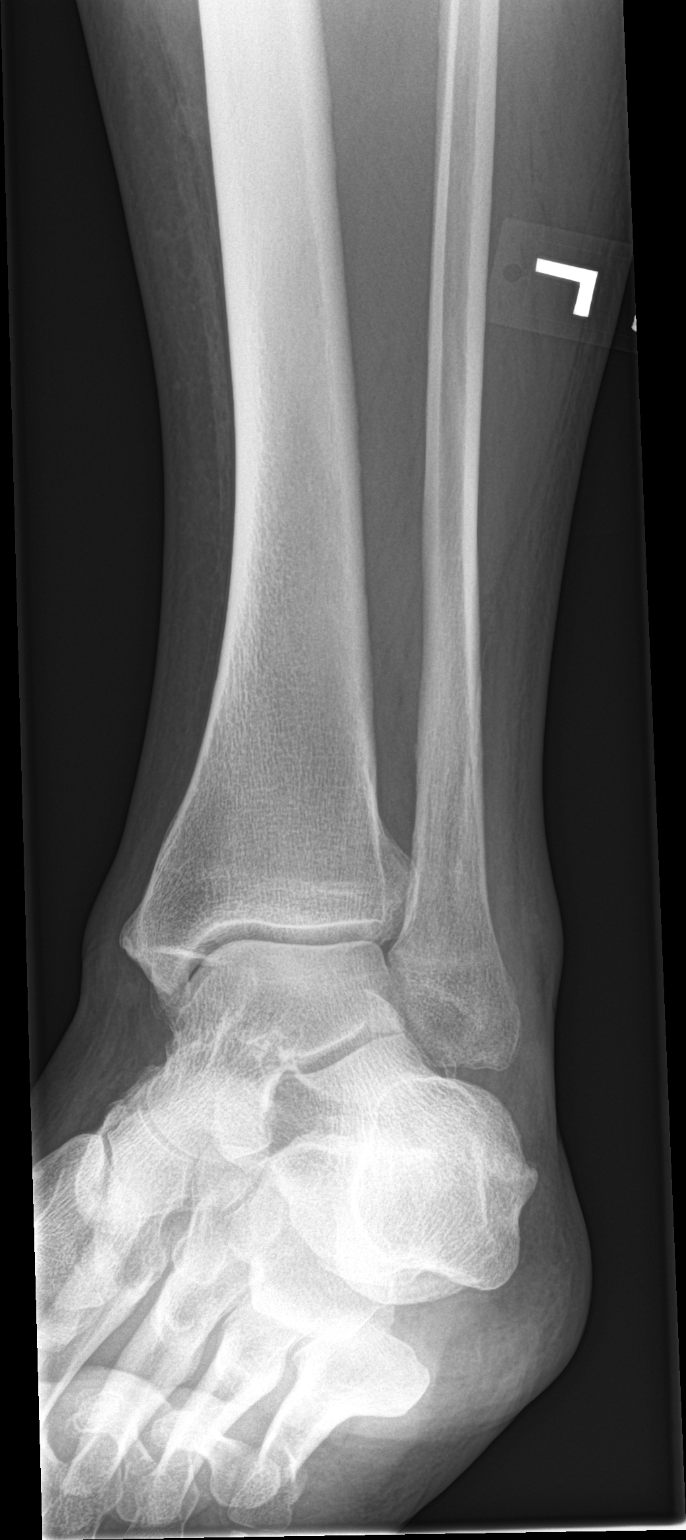

[ankle lat]
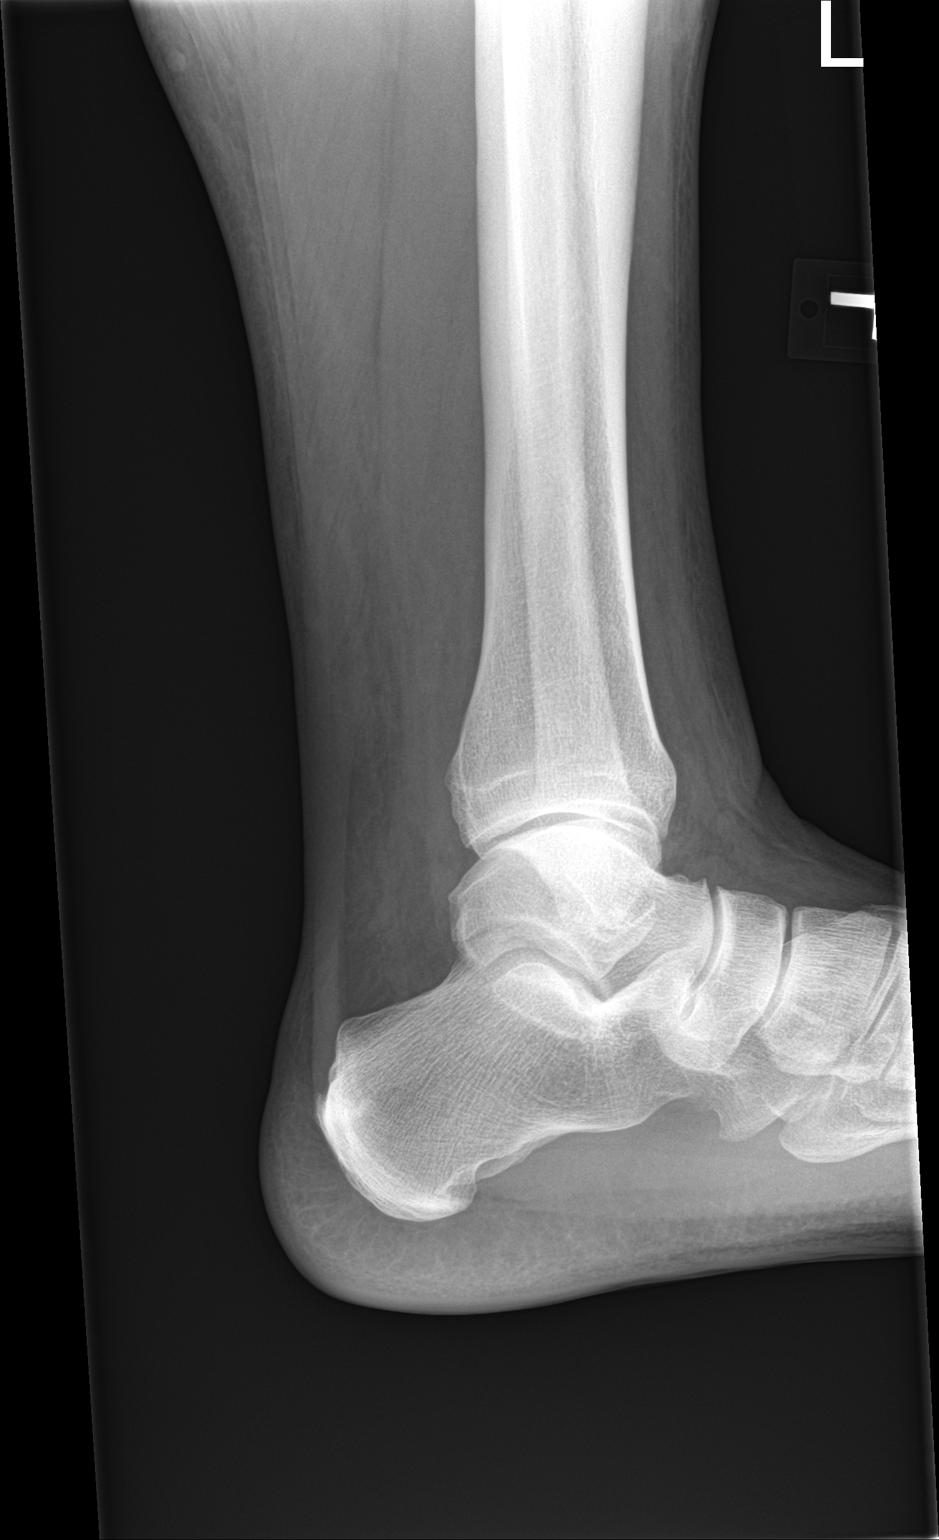

[3 of 3 positions shown; findings below may reference images not displayed]

FINDINGS: Lateral soft tissue swelling.

Osseous mineralization normal.

Joint spaces preserved.

No acute fracture, dislocation, or bone destruction.
IMPRESSION: No acute osseous abnormalities.

## 2019-05-29 ENCOUNTER — Encounter: Payer: PRIVATE HEALTH INSURANCE | Admitting: Family Medicine

## 2019-06-11 ENCOUNTER — Other Ambulatory Visit: Payer: Self-pay | Admitting: Family Medicine

## 2019-06-11 DIAGNOSIS — I10 Essential (primary) hypertension: Secondary | ICD-10-CM

## 2019-06-25 ENCOUNTER — Other Ambulatory Visit: Payer: Self-pay

## 2019-06-26 ENCOUNTER — Encounter: Payer: Self-pay | Admitting: Family Medicine

## 2019-06-26 ENCOUNTER — Ambulatory Visit (INDEPENDENT_AMBULATORY_CARE_PROVIDER_SITE_OTHER): Payer: PRIVATE HEALTH INSURANCE | Admitting: Family Medicine

## 2019-06-26 VITALS — BP 142/89 | HR 70 | Temp 97.7°F | Ht 72.0 in | Wt 310.8 lb

## 2019-06-26 DIAGNOSIS — I10 Essential (primary) hypertension: Secondary | ICD-10-CM

## 2019-06-26 DIAGNOSIS — Z0001 Encounter for general adult medical examination with abnormal findings: Secondary | ICD-10-CM | POA: Diagnosis not present

## 2019-06-26 DIAGNOSIS — L719 Rosacea, unspecified: Secondary | ICD-10-CM | POA: Diagnosis not present

## 2019-06-26 DIAGNOSIS — Z0000 Encounter for general adult medical examination without abnormal findings: Secondary | ICD-10-CM

## 2019-06-26 LAB — URINALYSIS
Bilirubin, UA: NEGATIVE
Glucose, UA: NEGATIVE
Ketones, UA: NEGATIVE
Leukocytes,UA: NEGATIVE
Nitrite, UA: NEGATIVE
Protein,UA: NEGATIVE
RBC, UA: NEGATIVE
Specific Gravity, UA: 1.01 (ref 1.005–1.030)
Urobilinogen, Ur: 0.2 mg/dL (ref 0.2–1.0)
pH, UA: 6.5 (ref 5.0–7.5)

## 2019-06-26 MED ORDER — VITAMIN D (ERGOCALCIFEROL) 1.25 MG (50000 UNIT) PO CAPS: 50000.0000 [IU] | ORAL_CAPSULE | ORAL | 1 refills | Status: DC

## 2019-06-26 MED ORDER — METRONIDAZOLE 1 % EX GEL: Freq: Every day | CUTANEOUS | 11 refills | Status: DC

## 2019-06-26 MED ORDER — FLUOXETINE HCL 20 MG PO TABS: 20.0000 mg | ORAL_TABLET | Freq: Every day | ORAL | 11 refills | Status: DC

## 2019-06-26 MED ORDER — AMLODIPINE BESY-BENAZEPRIL HCL 10-40 MG PO CAPS: 1.0000 | ORAL_CAPSULE | Freq: Every day | ORAL | 2 refills | Status: DC

## 2019-06-26 NOTE — Progress Notes (Addendum)
Subjective:  Patient ID: Donald Eaton, male    DOB: 09-08-1975  Age: 43 y.o. MRN: [id number]  CC: Annual Exam   HPI Donald Eaton presents for CPE.         Depression screen [facility] 2/9 05/28/2018 05/19/2017 11/14/2016  Decreased Interest 0 0 0  Down, Depressed, Hopeless 0 0 0  PHQ - 2 Score 0 0 0    History Donald Eaton has a past medical history of Hypertension and Obesity.   He has a past surgical history that includes none.   His family history includes Heart disease in his father.He reports that he has never smoked. He has never used smokeless tobacco. He reports that he does not drink alcohol or use drugs.    ROS Review of Systems  Constitutional: Negative for activity change, fatigue and unexpected weight change.  HENT: Negative for congestion, ear pain, hearing loss, postnasal drip and trouble swallowing.   Eyes: Negative for pain and visual disturbance.  Respiratory: Negative for cough, chest tightness and shortness of breath.   Cardiovascular: Negative for chest pain, palpitations and leg swelling.  Gastrointestinal: Negative for abdominal distention, abdominal pain, blood in stool, constipation, diarrhea, nausea and vomiting.  Endocrine: Negative for cold intolerance, heat intolerance and polydipsia.  Genitourinary: Negative for difficulty urinating, dysuria, flank pain, frequency and urgency.  Musculoskeletal: Negative for arthralgias and joint swelling.  Skin: Negative for color change, rash and wound.  Neurological: Negative for dizziness, syncope, speech difficulty, weakness, light-headedness, numbness and headaches.  Hematological: Does not bruise/bleed easily.  Psychiatric/Behavioral: Negative for confusion, decreased concentration, dysphoric mood and sleep disturbance. The patient is not nervous/anxious.     Objective:  BP (!) 142/89   Pulse 70   Temp 97.7 F (36.5 C) (Temporal)   Ht 6' (1.829 m)   Wt (!) 310 lb 12.8 oz (141 kg)   SpO2 97%   BMI 42.15  kg/m   BP Readings from Last 3 Encounters:  06/26/19 (!) 142/89  05/28/18 132/87  05/19/17 119/74    Wt Readings from Last 3 Encounters:  06/26/19 (!) 310 lb 12.8 oz (141 kg)  05/28/18 (!) 303 lb 12.8 oz (137.8 kg)  05/19/17 284 lb (128.8 kg)     Physical Exam Constitutional:      Appearance: He is well-developed.  HENT:     Head: Normocephalic and atraumatic.  Eyes:     Pupils: Pupils are equal, round, and reactive to light.  Neck:     Musculoskeletal: Normal range of motion.     Thyroid: No thyromegaly.     Trachea: No tracheal deviation.  Cardiovascular:     Rate and Rhythm: Normal rate and regular rhythm.     Heart sounds: Normal heart sounds. No murmur. No friction rub. No gallop.   Pulmonary:     Breath sounds: Normal breath sounds. No wheezing or rales.  Abdominal:     General: Bowel sounds are normal. There is no distension.     Palpations: Abdomen is soft. There is no mass.     Tenderness: There is no abdominal tenderness.     Hernia: There is no hernia in the left inguinal area.  Genitourinary:    Penis: Normal.      Scrotum/Testes: Normal.  Musculoskeletal: Normal range of motion.  Lymphadenopathy:     Cervical: No cervical adenopathy.  Skin:    General: Skin is warm and dry.  Neurological:     Mental Status: He is alert and oriented to person, place, and  time.       Assessment & Plan:   Donald Eaton was seen today for annual exam.  Diagnoses and all orders for this visit:  Well adult exam -     CBC with Differential/Platelet -     CMP14+EGFR -     Lipid panel -     Urinalysis -     VITAMIN D 25 Hydroxy (Vit-D Deficiency, Fractures)  Essential hypertension, benign -     amLODipine-benazepril (LOTREL) 10-40 MG capsule; Take 1 capsule by mouth daily. -     CBC with Differential/Platelet -     CMP14+EGFR -     Lipid panel -     Urinalysis -     VITAMIN D 25 Hydroxy (Vit-D Deficiency, Fractures)  Rosacea, acne -     metroNIDAZOLE (METROGEL)  1 % gel; Apply topically daily.  Other orders -     Vitamin D, Ergocalciferol, (DRISDOL) 1.25 MG (50000 UT) CAPS capsule; Take 1 capsule (50,000 Units total) by mouth every 7 (seven) days. -     FLUoxetine (PROZAC) 20 MG tablet; Take 1 tablet (20 mg total) by mouth daily.       I have changed Donald Eaton's Vitamin D (Ergocalciferol). I am also having him start on FLUoxetine. Additionally, I am having him maintain his amLODipine-benazepril and metroNIDAZOLE.  Allergies as of 06/26/2019      Reactions   Niaspan [niacin Er] Rash      Medication List       Accurate as of June 26, 2019 11:59 PM. If you have any questions, ask your nurse or doctor.        amLODipine-benazepril 10-40 MG capsule Commonly known as: LOTREL Take 1 capsule by mouth daily.   FLUoxetine 20 MG tablet Commonly known as: PROZAC Take 1 tablet (20 mg total) by mouth daily. Started by: Claretta Fraise, MD   metroNIDAZOLE 1 % gel Commonly known as: Metrogel Apply topically daily.   Vitamin D (Ergocalciferol) 1.25 MG (50000 UT) Caps capsule Commonly known as: DRISDOL Take 1 capsule (50,000 Units total) by mouth every 7 (seven) days.        Follow-up: No follow-ups on file.  Claretta Fraise, M.D.

## 2019-06-27 ENCOUNTER — Other Ambulatory Visit: Payer: Self-pay | Admitting: Family Medicine

## 2019-06-27 LAB — LIPID PANEL
Chol/HDL Ratio: 4.6 ratio (ref 0.0–5.0)
Cholesterol, Total: 187 mg/dL (ref 100–199)
HDL: 41 mg/dL (ref >39)
LDL Chol Calc (NIH): 123 mg/dL — ABNORMAL HIGH (ref 0–99)
Triglycerides: 125 mg/dL (ref 0–149)
VLDL Cholesterol Cal: 23 mg/dL (ref 5–40)

## 2019-06-27 LAB — CBC WITH DIFFERENTIAL/PLATELET
Basophils Absolute: 0 10*3/uL (ref 0.0–0.2)
Basos: 0 %
EOS (ABSOLUTE): 0.1 10*3/uL (ref 0.0–0.4)
Eos: 1 %
Hematocrit: 48.5 % (ref 37.5–51.0)
Hemoglobin: 15.7 g/dL (ref 13.0–17.7)
Immature Grans (Abs): 0 10*3/uL (ref 0.0–0.1)
Immature Granulocytes: 0 %
Lymphocytes Absolute: 1.5 10*3/uL (ref 0.7–3.1)
Lymphs: 32 %
MCH: 28.1 pg (ref 26.6–33.0)
MCHC: 32.4 g/dL (ref 31.5–35.7)
MCV: 87 fL (ref 79–97)
Monocytes Absolute: 0.4 10*3/uL (ref 0.1–0.9)
Monocytes: 9 %
Neutrophils Absolute: 2.7 10*3/uL (ref 1.4–7.0)
Neutrophils: 58 %
Platelets: 205 10*3/uL (ref 150–450)
RBC: 5.58 x10E6/uL (ref 4.14–5.80)
RDW: 12.5 % (ref 11.6–15.4)
WBC: 4.7 10*3/uL (ref 3.4–10.8)

## 2019-06-27 LAB — CMP14+EGFR
ALT: 20 IU/L (ref 0–44)
AST: 17 IU/L (ref 0–40)
Albumin/Globulin Ratio: 1.7 (ref 1.2–2.2)
Albumin: 4.6 g/dL (ref 4.0–5.0)
Alkaline Phosphatase: 72 IU/L (ref 39–117)
BUN/Creatinine Ratio: 17 (ref 9–20)
BUN: 19 mg/dL (ref 6–24)
Bilirubin Total: 0.8 mg/dL (ref 0.0–1.2)
CO2: 20 mmol/L (ref 20–29)
Calcium: 9.4 mg/dL (ref 8.7–10.2)
Chloride: 104 mmol/L (ref 96–106)
Creatinine, Ser: 1.15 mg/dL (ref 0.76–1.27)
GFR calc Af Amer: 90 mL/min/{1.73_m2} (ref >59)
GFR calc non Af Amer: 78 mL/min/{1.73_m2} (ref >59)
Globulin, Total: 2.7 g/dL (ref 1.5–4.5)
Glucose: 96 mg/dL (ref 65–99)
Potassium: 4.4 mmol/L (ref 3.5–5.2)
Sodium: 140 mmol/L (ref 134–144)
Total Protein: 7.3 g/dL (ref 6.0–8.5)

## 2019-06-27 LAB — VITAMIN D 25 HYDROXY (VIT D DEFICIENCY, FRACTURES): Vit D, 25-Hydroxy: 26.5 ng/mL — ABNORMAL LOW (ref 30.0–100.0)

## 2019-06-29 ENCOUNTER — Encounter: Payer: Self-pay | Admitting: Family Medicine

## 2019-10-04 ENCOUNTER — Other Ambulatory Visit: Payer: Self-pay | Admitting: Family Medicine

## 2019-10-04 DIAGNOSIS — I10 Essential (primary) hypertension: Secondary | ICD-10-CM

## 2020-07-26 ENCOUNTER — Other Ambulatory Visit: Payer: Self-pay | Admitting: Family Medicine

## 2020-07-26 DIAGNOSIS — I10 Essential (primary) hypertension: Secondary | ICD-10-CM

## 2020-08-20 ENCOUNTER — Encounter: Payer: PRIVATE HEALTH INSURANCE | Admitting: Family Medicine

## 2020-09-21 ENCOUNTER — Encounter: Payer: PRIVATE HEALTH INSURANCE | Admitting: Family Medicine

## 2020-10-19 ENCOUNTER — Other Ambulatory Visit: Payer: Self-pay

## 2020-10-19 ENCOUNTER — Ambulatory Visit (INDEPENDENT_AMBULATORY_CARE_PROVIDER_SITE_OTHER): Payer: PRIVATE HEALTH INSURANCE | Admitting: Family Medicine

## 2020-10-19 ENCOUNTER — Encounter: Payer: Self-pay | Admitting: Family Medicine

## 2020-10-19 VITALS — BP 143/93 | HR 68 | Temp 97.3°F | Ht 73.0 in | Wt 338.8 lb

## 2020-10-19 DIAGNOSIS — Z1211 Encounter for screening for malignant neoplasm of colon: Secondary | ICD-10-CM

## 2020-10-19 DIAGNOSIS — Z0001 Encounter for general adult medical examination with abnormal findings: Secondary | ICD-10-CM | POA: Diagnosis not present

## 2020-10-19 DIAGNOSIS — Z0000 Encounter for general adult medical examination without abnormal findings: Secondary | ICD-10-CM

## 2020-10-19 DIAGNOSIS — I10 Essential (primary) hypertension: Secondary | ICD-10-CM

## 2020-10-19 MED ORDER — VITAMIN D (ERGOCALCIFEROL) 1.25 MG (50000 UNIT) PO CAPS: 50000.0000 [IU] | ORAL_CAPSULE | ORAL | 1 refills | Status: DC

## 2020-10-19 MED ORDER — AMLODIPINE BESY-BENAZEPRIL HCL 10-40 MG PO CAPS: 1.0000 | ORAL_CAPSULE | Freq: Every day | ORAL | 1 refills | Status: DC

## 2020-10-19 NOTE — Progress Notes (Signed)
Subjective:  Patient ID: Donald Eaton, male    DOB: 01-05-1976  Age: 45 y.o. MRN: [id number]  CC: Annual Exam   HPI Donald Eaton presents for annual exam. Has a lot going on - son broke both legs. Let himself go. Has lost 9 lbs this month. Cutting out sweets, drinks, processed foods.  s  Depression screen [facility] 2/9 10/19/2020 05/28/2018 05/19/2017  Decreased Interest 0 0 0  Down, Depressed, Hopeless 0 0 0  PHQ - 2 Score 0 0 0    History Donald Eaton has a past medical history of Hypertension and Obesity.   He has a past surgical history that includes none.   His family history includes Heart disease in his father.He reports that he has never smoked. He has never used smokeless tobacco. He reports that he does not drink alcohol and does not use drugs.    ROS Review of Systems  Constitutional: Negative for activity change, fatigue, fever and unexpected weight change.  HENT: Negative for congestion, ear pain, hearing loss, postnasal drip and trouble swallowing.   Eyes: Negative for pain.  Respiratory: Negative for cough, chest tightness and shortness of breath.   Cardiovascular: Negative for chest pain, palpitations and leg swelling.  Gastrointestinal: Negative for abdominal distention, abdominal pain, blood in stool, constipation, diarrhea, nausea and vomiting.  Endocrine: Negative for cold intolerance, heat intolerance and polydipsia.  Genitourinary: Negative for difficulty urinating, dysuria, flank pain, frequency and urgency.  Musculoskeletal: Negative for arthralgias and joint swelling.  Skin: Negative for color change, rash and wound.  Neurological: Negative for dizziness, syncope, speech difficulty, weakness, light-headedness, numbness and headaches.  Hematological: Does not bruise/bleed easily.  Psychiatric/Behavioral: Negative for confusion, decreased concentration, dysphoric mood and sleep disturbance. The patient is not nervous/anxious.     Objective:  BP (!) 143/93    Pulse 68   Temp (!) 97.3 F (36.3 C) (Temporal)   Ht _0  (1.854 m)   Wt (!) 338 lb 12.8 oz (153.7 kg)   SpO2 100%   BMI 44.70 kg/m   BP Readings from Last 3 Encounters:  10/19/20 (!) 143/93  06/26/19 (!) 142/89  05/28/18 132/87    Wt Readings from Last 3 Encounters:  10/19/20 (!) 338 lb 12.8 oz (153.7 kg)  06/26/19 (!) 310 lb 12.8 oz (141 kg)  05/28/18 (!) 303 lb 12.8 oz (137.8 kg)     Physical Exam Constitutional:      Appearance: He is well-developed and well-nourished. He is obese.  HENT:     Head: Normocephalic and atraumatic.     Mouth/Throat:     Mouth: Oropharynx is clear and moist.  Eyes:     Extraocular Movements: EOM normal.     Pupils: Pupils are equal, round, and reactive to light.  Neck:     Thyroid: No thyromegaly.     Trachea: No tracheal deviation.  Cardiovascular:     Rate and Rhythm: Normal rate and regular rhythm.     Heart sounds: Normal heart sounds. No murmur heard. No friction rub. No gallop.   Pulmonary:     Breath sounds: Normal breath sounds. No wheezing or rales.  Abdominal:     General: Bowel sounds are normal. There is no distension.     Palpations: Abdomen is soft. There is no mass.     Tenderness: There is no abdominal tenderness.     Hernia: There is no hernia in the right inguinal area or left inguinal area.  Genitourinary:    Penis: Normal.  Testes: Normal.  Musculoskeletal:        General: No edema. Normal range of motion.     Cervical back: Normal range of motion.  Lymphadenopathy:     Cervical: No cervical adenopathy.  Skin:    General: Skin is warm and dry.  Neurological:     Mental Status: He is alert and oriented to person, place, and time.  Psychiatric:        Mood and Affect: Mood and affect normal.       Assessment & Plan:   Donald Eaton was seen today for annual exam.  Diagnoses and all orders for this visit:  Well adult exam -     CBC with Differential/Platelet -     CMP14+EGFR -     PSA, total  and free -     Lipid panel  Essential hypertension, benign -     amLODipine-benazepril (LOTREL) 10-40 MG capsule; Take 1 capsule by mouth daily. -     CBC with Differential/Platelet -     CMP14+EGFR -     PSA, total and free -     Lipid panel  Screen for colon cancer -     Ambulatory referral to Gastroenterology  Morbid obesity ([place])  Other orders -     Vitamin D, Ergocalciferol, (DRISDOL) 1.25 MG (50000 UNIT) CAPS capsule; Take 1 capsule (50,000 Units total) by mouth every 7 (seven) days.     Reviewed low carb weight loss program with pt.  I have discontinued Colbey Clavel's metroNIDAZOLE and FLUoxetine. I have also changed his Vitamin D (Ergocalciferol). Additionally, I am having him maintain his amLODipine-benazepril.  Allergies as of 10/19/2020      Reactions   Niaspan [niacin Er] Rash      Medication List       Accurate as of October 19, 2020 10:15 AM. If you have any questions, ask your nurse or doctor.        STOP taking these medications   FLUoxetine 20 MG tablet Commonly known as: PROZAC Stopped by: Claretta Fraise, MD   metroNIDAZOLE 1 % gel Commonly known as: Metrogel Stopped by: Claretta Fraise, MD     TAKE these medications   amLODipine-benazepril 10-40 MG capsule Commonly known as: LOTREL Take 1 capsule by mouth daily.   Vitamin D (Ergocalciferol) 1.25 MG (50000 UNIT) Caps capsule Commonly known as: DRISDOL Take 1 capsule (50,000 Units total) by mouth every 7 (seven) days.        Follow-up: Return in about 2 months (around 12/17/2020).  Claretta Fraise, M.D.

## 2020-10-20 ENCOUNTER — Other Ambulatory Visit: Payer: Self-pay | Admitting: Family Medicine

## 2020-10-20 LAB — CBC WITH DIFFERENTIAL/PLATELET
Basophils Absolute: 0 10*3/uL (ref 0.0–0.2)
Basos: 1 %
EOS (ABSOLUTE): 0.1 10*3/uL (ref 0.0–0.4)
Eos: 1 %
Hematocrit: 48 % (ref 37.5–51.0)
Hemoglobin: 16 g/dL (ref 13.0–17.7)
Immature Grans (Abs): 0 10*3/uL (ref 0.0–0.1)
Immature Granulocytes: 1 %
Lymphocytes Absolute: 1.7 10*3/uL (ref 0.7–3.1)
Lymphs: 38 %
MCH: 29.4 pg (ref 26.6–33.0)
MCHC: 33.3 g/dL (ref 31.5–35.7)
MCV: 88 fL (ref 79–97)
Monocytes Absolute: 0.4 10*3/uL (ref 0.1–0.9)
Monocytes: 10 %
Neutrophils Absolute: 2.2 10*3/uL (ref 1.4–7.0)
Neutrophils: 49 %
Platelets: 168 10*3/uL (ref 150–450)
RBC: 5.45 x10E6/uL (ref 4.14–5.80)
RDW: 12.9 % (ref 11.6–15.4)
WBC: 4.3 10*3/uL (ref 3.4–10.8)

## 2020-10-20 LAB — CMP14+EGFR
ALT: 23 IU/L (ref 0–44)
AST: 19 IU/L (ref 0–40)
Albumin/Globulin Ratio: 1.7 (ref 1.2–2.2)
Albumin: 4.5 g/dL (ref 4.0–5.0)
Alkaline Phosphatase: 68 IU/L (ref 44–121)
BUN/Creatinine Ratio: 14 (ref 9–20)
BUN: 18 mg/dL (ref 6–24)
Bilirubin Total: 0.9 mg/dL (ref 0.0–1.2)
CO2: 21 mmol/L (ref 20–29)
Calcium: 9.4 mg/dL (ref 8.7–10.2)
Chloride: 105 mmol/L (ref 96–106)
Creatinine, Ser: 1.27 mg/dL (ref 0.76–1.27)
Globulin, Total: 2.7 g/dL (ref 1.5–4.5)
Glucose: 107 mg/dL — ABNORMAL HIGH (ref 65–99)
Potassium: 4.9 mmol/L (ref 3.5–5.2)
Sodium: 140 mmol/L (ref 134–144)
Total Protein: 7.2 g/dL (ref 6.0–8.5)
eGFR: 71 mL/min/{1.73_m2} (ref >59)

## 2020-10-20 LAB — LIPID PANEL
Chol/HDL Ratio: 5.1 ratio — ABNORMAL HIGH (ref 0.0–5.0)
Cholesterol, Total: 213 mg/dL — ABNORMAL HIGH (ref 100–199)
HDL: 42 mg/dL (ref >39)
LDL Chol Calc (NIH): 151 mg/dL — ABNORMAL HIGH (ref 0–99)
Triglycerides: 112 mg/dL (ref 0–149)
VLDL Cholesterol Cal: 20 mg/dL (ref 5–40)

## 2020-10-20 LAB — PSA, TOTAL AND FREE
PSA, Free Pct: 56.7 %
PSA, Free: 0.17 ng/mL
Prostate Specific Ag, Serum: 0.3 ng/mL (ref 0.0–4.0)

## 2020-10-20 MED ORDER — ROSUVASTATIN CALCIUM 10 MG PO TABS: 10.0000 mg | ORAL_TABLET | Freq: Every day | ORAL | 1 refills | Status: DC

## 2020-12-16 ENCOUNTER — Ambulatory Visit: Payer: PRIVATE HEALTH INSURANCE | Admitting: Family Medicine

## 2021-04-21 ENCOUNTER — Telehealth: Payer: Self-pay | Admitting: Family Medicine

## 2021-04-21 NOTE — Telephone Encounter (Signed)
IT should be under 130/80. Have him check daiuly for a week. If it stays higher, then I need to see him

## 2021-04-21 NOTE — Telephone Encounter (Signed)
Patient aware.

## 2021-04-27 ENCOUNTER — Other Ambulatory Visit: Payer: Self-pay | Admitting: Family Medicine

## 2021-04-27 DIAGNOSIS — I10 Essential (primary) hypertension: Secondary | ICD-10-CM

## 2021-04-28 ENCOUNTER — Other Ambulatory Visit: Payer: Self-pay | Admitting: *Deleted

## 2021-04-28 DIAGNOSIS — I10 Essential (primary) hypertension: Secondary | ICD-10-CM

## 2021-05-21 ENCOUNTER — Other Ambulatory Visit: Payer: Self-pay | Admitting: *Deleted

## 2021-05-21 ENCOUNTER — Ambulatory Visit: Payer: PRIVATE HEALTH INSURANCE | Admitting: Family Medicine

## 2021-05-21 DIAGNOSIS — I10 Essential (primary) hypertension: Secondary | ICD-10-CM

## 2021-05-21 MED ORDER — AMLODIPINE BESY-BENAZEPRIL HCL 10-40 MG PO CAPS: 1.0000 | ORAL_CAPSULE | Freq: Every day | ORAL | 0 refills | Status: DC

## 2021-06-14 ENCOUNTER — Encounter: Payer: Self-pay | Admitting: Family Medicine

## 2021-06-14 ENCOUNTER — Ambulatory Visit: Payer: No Typology Code available for payment source | Admitting: Family Medicine

## 2021-06-14 ENCOUNTER — Other Ambulatory Visit: Payer: Self-pay

## 2021-06-14 VITALS — BP 161/87 | HR 103 | Temp 97.8°F | Ht 73.0 in | Wt 343.0 lb

## 2021-06-14 DIAGNOSIS — I10 Essential (primary) hypertension: Secondary | ICD-10-CM | POA: Diagnosis not present

## 2021-06-14 DIAGNOSIS — E785 Hyperlipidemia, unspecified: Secondary | ICD-10-CM | POA: Diagnosis not present

## 2021-06-14 MED ORDER — CHLORTHALIDONE 25 MG PO TABS: 25.0000 mg | ORAL_TABLET | Freq: Every day | ORAL | 3 refills | Status: DC

## 2021-06-14 MED ORDER — AMLODIPINE BESY-BENAZEPRIL HCL 10-40 MG PO CAPS: 1.0000 | ORAL_CAPSULE | Freq: Every day | ORAL | 3 refills | Status: DC

## 2021-06-14 MED ORDER — ROSUVASTATIN CALCIUM 10 MG PO TABS: 10.0000 mg | ORAL_TABLET | Freq: Every day | ORAL | 1 refills | Status: DC

## 2021-06-14 NOTE — Progress Notes (Signed)
Subjective:  Patient ID: Donald Eaton, male    DOB: 1975-10-30  Age: 45 y.o. MRN: [id number]  CC: Medical Management of Chronic Issues   HPI Donald Eaton presents for  follow-up of hypertension. Patient has no history of headache chest pain or shortness of breath or recent cough. Patient also denies symptoms of TIA such as focal numbness or weakness. Patient denies side effects from medication. States taking it regularly.  Father died. Daughter totalled her car. She's okay.  Well went dry. Pump went dry. Life is tough right now. Not losing the weight he had planned.  History Donald Eaton has a past medical history of Hypertension and Obesity.   He has a past surgical history that includes none.   His family history includes Heart disease in his father.He reports that he has never smoked. He has never used smokeless tobacco. He reports that he does not drink alcohol and does not use drugs.  Current Outpatient Medications on File Prior to Visit  Medication Sig Dispense Refill   Vitamin D, Ergocalciferol, (DRISDOL) 1.25 MG (50000 UNIT) CAPS capsule Take 1 capsule (50,000 Units total) by mouth every 7 (seven) days. 13 capsule 1   No current facility-administered medications on file prior to visit.    ROS Review of Systems  Constitutional:  Negative for fever.  Respiratory:  Negative for shortness of breath.   Cardiovascular:  Negative for chest pain.  Musculoskeletal:  Negative for arthralgias.  Skin:  Negative for rash.   Objective:  BP (!) 161/87   Pulse (!) 103   Temp 97.8 F (36.6 C)   Ht 6' 1"  (1.854 m)   Wt (!) 343 lb (155.6 kg)   SpO2 100%   BMI 45.25 kg/m   BP Readings from Last 3 Encounters:  06/14/21 (!) 161/87  10/19/20 (!) 143/93  06/26/19 (!) 142/89    Wt Readings from Last 3 Encounters:  06/14/21 (!) 343 lb (155.6 kg)  10/19/20 (!) 338 lb 12.8 oz (153.7 kg)  06/26/19 (!) 310 lb 12.8 oz (141 kg)     Physical Exam Vitals reviewed.   Constitutional:      Appearance: He is well-developed. He is obese.  HENT:     Head: Normocephalic and atraumatic.     Right Ear: External ear normal.     Left Ear: External ear normal.     Mouth/Throat:     Pharynx: No oropharyngeal exudate or posterior oropharyngeal erythema.  Eyes:     Pupils: Pupils are equal, round, and reactive to light.  Cardiovascular:     Rate and Rhythm: Normal rate and regular rhythm.     Heart sounds: No murmur heard. Pulmonary:     Effort: No respiratory distress.     Breath sounds: Normal breath sounds.  Musculoskeletal:     Cervical back: Normal range of motion and neck supple.  Neurological:     Mental Status: He is alert and oriented to person, place, and time.      Assessment & Plan:   Donald Eaton was seen today for medical management of chronic issues.  Diagnoses and all orders for this visit:  Essential hypertension, benign -     CBC with Differential/Platelet -     CMP14+EGFR -     amLODipine-benazepril (LOTREL) 10-40 MG capsule; Take 1 capsule by mouth daily. (NEEDS TO BE SEEN BEFORE NEXT REFILL)  Hyperlipidemia, unspecified hyperlipidemia type -     Lipid panel  Morbid obesity ([place])  Other orders -  rosuvastatin (CRESTOR) 10 MG tablet; Take 1 tablet (10 mg total) by mouth daily. For cholesterol -     chlorthalidone (HYGROTON) 25 MG tablet; Take 1 tablet (25 mg total) by mouth daily.  Allergies as of 06/14/2021       Reactions   Niaspan [niacin Er] Rash        Medication List        Accurate as of June 14, 2021  5:03 PM. If you have any questions, ask your nurse or doctor.          amLODipine-benazepril 10-40 MG capsule Commonly known as: LOTREL Take 1 capsule by mouth daily. (NEEDS TO BE SEEN BEFORE NEXT REFILL)   chlorthalidone 25 MG tablet Commonly known as: HYGROTON Take 1 tablet (25 mg total) by mouth daily. Started by: Claretta Fraise, MD   rosuvastatin 10 MG tablet Commonly known as: Crestor Take  1 tablet (10 mg total) by mouth daily. For cholesterol   Vitamin D (Ergocalciferol) 1.25 MG (50000 UNIT) Caps capsule Commonly known as: DRISDOL Take 1 capsule (50,000 Units total) by mouth every 7 (seven) days.        Meds ordered this encounter  Medications   amLODipine-benazepril (LOTREL) 10-40 MG capsule    Sig: Take 1 capsule by mouth daily. (NEEDS TO BE SEEN BEFORE NEXT REFILL)    Dispense:  90 capsule    Refill:  3   rosuvastatin (CRESTOR) 10 MG tablet    Sig: Take 1 tablet (10 mg total) by mouth daily. For cholesterol    Dispense:  90 tablet    Refill:  1   chlorthalidone (HYGROTON) 25 MG tablet    Sig: Take 1 tablet (25 mg total) by mouth daily.    Dispense:  90 tablet    Refill:  3    Encouraged pt. To pursue weight loss. 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  Follow-up: Return in about 6 months (around 12/13/2021) for [employer] physical, monitor BP at work. F/U 4 weeks if not coming down. Claretta Fraise, M.D.

## 2021-06-15 LAB — CMP14+EGFR
ALT: 29 IU/L (ref 0–44)
AST: 20 IU/L (ref 0–40)
Albumin/Globulin Ratio: 1.8 (ref 1.2–2.2)
Albumin: 4.8 g/dL (ref 4.0–5.0)
Alkaline Phosphatase: 84 IU/L (ref 44–121)
BUN/Creatinine Ratio: 11 (ref 9–20)
BUN: 16 mg/dL (ref 6–24)
Bilirubin Total: 0.8 mg/dL (ref 0.0–1.2)
CO2: 21 mmol/L (ref 20–29)
Calcium: 9.6 mg/dL (ref 8.7–10.2)
Chloride: 105 mmol/L (ref 96–106)
Creatinine, Ser: 1.42 mg/dL — ABNORMAL HIGH (ref 0.76–1.27)
Globulin, Total: 2.7 g/dL (ref 1.5–4.5)
Glucose: 93 mg/dL (ref 70–99)
Potassium: 4.8 mmol/L (ref 3.5–5.2)
Sodium: 141 mmol/L (ref 134–144)
Total Protein: 7.5 g/dL (ref 6.0–8.5)
eGFR: 62 mL/min/{1.73_m2} (ref >59)

## 2021-06-15 LAB — CBC WITH DIFFERENTIAL/PLATELET
Basophils Absolute: 0 10*3/uL (ref 0.0–0.2)
Basos: 0 %
EOS (ABSOLUTE): 0.1 10*3/uL (ref 0.0–0.4)
Eos: 2 %
Hematocrit: 48.4 % (ref 37.5–51.0)
Hemoglobin: 16.4 g/dL (ref 13.0–17.7)
Immature Grans (Abs): 0 10*3/uL (ref 0.0–0.1)
Immature Granulocytes: 0 %
Lymphocytes Absolute: 2.6 10*3/uL (ref 0.7–3.1)
Lymphs: 39 %
MCH: 29.7 pg (ref 26.6–33.0)
MCHC: 33.9 g/dL (ref 31.5–35.7)
MCV: 88 fL (ref 79–97)
Monocytes Absolute: 0.5 10*3/uL (ref 0.1–0.9)
Monocytes: 7 %
Neutrophils Absolute: 3.4 10*3/uL (ref 1.4–7.0)
Neutrophils: 52 %
Platelets: 187 10*3/uL (ref 150–450)
RBC: 5.52 x10E6/uL (ref 4.14–5.80)
RDW: 12.7 % (ref 11.6–15.4)
WBC: 6.6 10*3/uL (ref 3.4–10.8)

## 2021-06-15 LAB — LIPID PANEL
Chol/HDL Ratio: 5.7 ratio — ABNORMAL HIGH (ref 0.0–5.0)
Cholesterol, Total: 199 mg/dL (ref 100–199)
HDL: 35 mg/dL — ABNORMAL LOW (ref >39)
LDL Chol Calc (NIH): 110 mg/dL — ABNORMAL HIGH (ref 0–99)
Triglycerides: 312 mg/dL — ABNORMAL HIGH (ref 0–149)
VLDL Cholesterol Cal: 54 mg/dL — ABNORMAL HIGH (ref 5–40)

## 2021-09-28 ENCOUNTER — Other Ambulatory Visit: Payer: Self-pay | Admitting: Family Medicine

## 2021-12-14 ENCOUNTER — Encounter: Payer: No Typology Code available for payment source | Admitting: Family Medicine

## 2022-01-10 ENCOUNTER — Other Ambulatory Visit: Payer: Self-pay | Admitting: Family Medicine

## 2022-01-24 ENCOUNTER — Encounter: Payer: Self-pay | Admitting: Family Medicine

## 2022-01-24 ENCOUNTER — Ambulatory Visit (INDEPENDENT_AMBULATORY_CARE_PROVIDER_SITE_OTHER): Payer: PRIVATE HEALTH INSURANCE | Admitting: Family Medicine

## 2022-01-24 VITALS — BP 132/82 | HR 94 | Temp 97.7°F | Ht 73.0 in | Wt 333.2 lb

## 2022-01-24 DIAGNOSIS — E785 Hyperlipidemia, unspecified: Secondary | ICD-10-CM | POA: Diagnosis not present

## 2022-01-24 DIAGNOSIS — Z0000 Encounter for general adult medical examination without abnormal findings: Secondary | ICD-10-CM

## 2022-01-24 DIAGNOSIS — Z0001 Encounter for general adult medical examination with abnormal findings: Secondary | ICD-10-CM

## 2022-01-24 DIAGNOSIS — E559 Vitamin D deficiency, unspecified: Secondary | ICD-10-CM | POA: Diagnosis not present

## 2022-01-24 DIAGNOSIS — I10 Essential (primary) hypertension: Secondary | ICD-10-CM | POA: Diagnosis not present

## 2022-01-24 LAB — URINALYSIS
Bilirubin, UA: NEGATIVE
Glucose, UA: NEGATIVE
Ketones, UA: NEGATIVE
Leukocytes,UA: NEGATIVE
Nitrite, UA: NEGATIVE
Protein,UA: NEGATIVE
Specific Gravity, UA: 1.015 (ref 1.005–1.030)
Urobilinogen, Ur: 0.2 mg/dL (ref 0.2–1.0)
pH, UA: 6 (ref 5.0–7.5)

## 2022-01-24 MED ORDER — SCOPOLAMINE 1 MG/3DAYS TD PT72: 1.0000 | MEDICATED_PATCH | TRANSDERMAL | 0 refills | Status: DC

## 2022-01-24 MED ORDER — ROSUVASTATIN CALCIUM 10 MG PO TABS: 10.0000 mg | ORAL_TABLET | Freq: Every day | ORAL | 2 refills | Status: DC

## 2022-01-24 NOTE — Progress Notes (Signed)
Subjective:  Patient ID: Donald Eaton, male    DOB: 1975-09-28  Age: 46 y.o. MRN: [id number]  CC: Annual Exam   HPI Donald Eaton presents for Complete P.E.   Working on weight loss for three months. Cutting out junk. Walking more. Starting a Biomedical scientist business.   Going on [employer] soon. Son Radio broadcast assistant from high school.       01/24/2022   10:57 AM 06/14/2021    4:44 PM 06/14/2021    4:18 PM  Depression screen PHQ 2/9  Decreased Interest 0 0 0  Down, Depressed, Hopeless 0 0 0  PHQ - 2 Score 0 0 0  Altered sleeping  0   Tired, decreased energy  0   Change in appetite  1   Feeling bad or failure about yourself   0   Trouble concentrating  0   Moving slowly or fidgety/restless  0   Suicidal thoughts  0   PHQ-9 Score  1   Difficult doing work/chores  Not difficult at all     History Donald Eaton has a past medical history of Hypertension and Obesity.   Donald Eaton has a past surgical history that includes none.   His family history includes Heart disease in his father.Donald Eaton reports that Donald Eaton has never smoked. Donald Eaton has never used smokeless tobacco. Donald Eaton reports that Donald Eaton does not drink alcohol and does not use drugs.    ROS Review of Systems  Constitutional:  Negative for activity change, fatigue and unexpected weight change.  HENT:  Negative for congestion, ear pain, hearing loss, postnasal drip and trouble swallowing.   Eyes:  Negative for pain and visual disturbance.  Respiratory:  Negative for cough, chest tightness and shortness of breath.   Cardiovascular:  Negative for chest pain, palpitations and leg swelling.  Gastrointestinal:  Negative for abdominal distention, abdominal pain, blood in stool, constipation, diarrhea, nausea and vomiting.  Endocrine: Negative for cold intolerance, heat intolerance and polydipsia.  Genitourinary:  Negative for difficulty urinating, dysuria, flank pain, frequency and urgency.  Musculoskeletal:  Negative for arthralgias and joint swelling.   Skin:  Negative for color change, rash and wound.       2 moles on back to look at.   Neurological:  Negative for dizziness, syncope, speech difficulty, weakness, light-headedness, numbness and headaches.  Hematological:  Does not bruise/bleed easily.  Psychiatric/Behavioral:  Negative for confusion, decreased concentration, dysphoric mood and sleep disturbance. The patient is not nervous/anxious.    Objective:  BP 132/82   Pulse 94   Temp 97.7 F (36.5 C)   Ht 6' 1"  (1.854 m)   Wt (!) 333 lb 3.2 oz (151.1 kg)   SpO2 96%   BMI 43.96 kg/m   BP Readings from Last 3 Encounters:  01/24/22 132/82  06/14/21 (!) 161/87  10/19/20 (!) 143/93    Wt Readings from Last 3 Encounters:  01/24/22 (!) 333 lb 3.2 oz (151.1 kg)  06/14/21 (!) 343 lb (155.6 kg)  10/19/20 (!) 338 lb 12.8 oz (153.7 kg)     Physical Exam Constitutional:      Appearance: Donald Eaton is well-developed. Donald Eaton is obese.  HENT:     Head: Normocephalic and atraumatic.  Eyes:     Pupils: Pupils are equal, round, and reactive to light.  Neck:     Thyroid: No thyromegaly.     Trachea: No tracheal deviation.  Cardiovascular:     Rate and Rhythm: Normal rate and regular rhythm.     Heart sounds: Normal heart  sounds. No murmur heard.   No friction rub. No gallop.  Pulmonary:     Breath sounds: Normal breath sounds. No wheezing or rales.  Abdominal:     General: Bowel sounds are normal. There is no distension.     Palpations: Abdomen is soft. There is no mass.     Tenderness: There is no abdominal tenderness.     Hernia: There is no hernia in the left inguinal area.  Genitourinary:    Penis: Normal.      Testes: Normal.  Musculoskeletal:        General: Normal range of motion.     Cervical back: Normal range of motion.  Lymphadenopathy:     Cervical: No cervical adenopathy.  Skin:    General: Skin is warm and dry.  Neurological:     Mental Status: Donald Eaton is alert and oriented to person, place, and time.       Assessment & Plan:   Donald Eaton was seen today for annual exam.  Diagnoses and all orders for this visit:  Well adult exam -     CBC with Differential/Platelet -     CMP14+EGFR -     Lipid panel -     Urinalysis -     VITAMIN D 25 Hydroxy (Vit-D Deficiency, Fractures)  Hyperlipidemia, unspecified hyperlipidemia type -     Lipid panel  Essential hypertension, benign -     CBC with Differential/Platelet -     CMP14+EGFR  Vitamin D deficiency -     VITAMIN D 25 Hydroxy (Vit-D Deficiency, Fractures)  Morbid obesity ([place])  Other orders -     rosuvastatin (CRESTOR) 10 MG tablet; Take 1 tablet (10 mg total) by mouth daily. For cholesterol -     scopolamine (TRANSDERM-SCOP) 1 MG/3DAYS; Place 1 patch (1.5 mg total) onto the skin every 3 (three) days.       I am having Donald Eaton start on scopolamine. I am also having him maintain his Vitamin D (Ergocalciferol), amLODipine-benazepril, chlorthalidone, and rosuvastatin.  Allergies as of 01/24/2022       Reactions   Niaspan [niacin Er] Rash        Medication List        Accurate as of January 24, 2022 11:41 AM. If you have any questions, ask your nurse or doctor.          amLODipine-benazepril 10-40 MG capsule Commonly known as: LOTREL Take 1 capsule by mouth daily. (NEEDS TO BE SEEN BEFORE NEXT REFILL)   chlorthalidone 25 MG tablet Commonly known as: HYGROTON Take 1 tablet (25 mg total) by mouth daily.   rosuvastatin 10 MG tablet Commonly known as: CRESTOR Take 1 tablet (10 mg total) by mouth daily. For cholesterol   scopolamine 1 MG/3DAYS Commonly known as: Transderm-Scop Place 1 patch (1.5 mg total) onto the skin every 3 (three) days. Started by: Claretta Fraise, MD   Vitamin D (Ergocalciferol) 1.25 MG (50000 UNIT) Caps capsule Commonly known as: DRISDOL Take 1 capsule (50,000 Units total) by mouth every 7 (seven) days.         Follow-up: No follow-ups on file.  Claretta Fraise, M.D.

## 2022-01-25 LAB — CMP14+EGFR
ALT: 22 IU/L (ref 0–44)
AST: 21 IU/L (ref 0–40)
Albumin/Globulin Ratio: 1.8 (ref 1.2–2.2)
Albumin: 4.6 g/dL (ref 4.0–5.0)
Alkaline Phosphatase: 46 IU/L (ref 44–121)
BUN/Creatinine Ratio: 16 (ref 9–20)
BUN: 20 mg/dL (ref 6–24)
Bilirubin Total: 0.8 mg/dL (ref 0.0–1.2)
CO2: 24 mmol/L (ref 20–29)
Calcium: 9.6 mg/dL (ref 8.7–10.2)
Chloride: 104 mmol/L (ref 96–106)
Creatinine, Ser: 1.28 mg/dL — ABNORMAL HIGH (ref 0.76–1.27)
Globulin, Total: 2.6 g/dL (ref 1.5–4.5)
Glucose: 99 mg/dL (ref 70–99)
Potassium: 4.4 mmol/L (ref 3.5–5.2)
Sodium: 141 mmol/L (ref 134–144)
Total Protein: 7.2 g/dL (ref 6.0–8.5)
eGFR: 70 mL/min/{1.73_m2} (ref >59)

## 2022-01-25 LAB — CBC WITH DIFFERENTIAL/PLATELET
Basophils Absolute: 0 10*3/uL (ref 0.0–0.2)
Basos: 0 %
EOS (ABSOLUTE): 0 10*3/uL (ref 0.0–0.4)
Eos: 1 %
Hematocrit: 48.4 % (ref 37.5–51.0)
Hemoglobin: 16.2 g/dL (ref 13.0–17.7)
Immature Grans (Abs): 0 10*3/uL (ref 0.0–0.1)
Immature Granulocytes: 0 %
Lymphocytes Absolute: 1.5 10*3/uL (ref 0.7–3.1)
Lymphs: 31 %
MCH: 29.3 pg (ref 26.6–33.0)
MCHC: 33.5 g/dL (ref 31.5–35.7)
MCV: 88 fL (ref 79–97)
Monocytes Absolute: 0.3 10*3/uL (ref 0.1–0.9)
Monocytes: 7 %
Neutrophils Absolute: 3 10*3/uL (ref 1.4–7.0)
Neutrophils: 61 %
Platelets: 179 10*3/uL (ref 150–450)
RBC: 5.53 x10E6/uL (ref 4.14–5.80)
RDW: 12.3 % (ref 11.6–15.4)
WBC: 4.9 10*3/uL (ref 3.4–10.8)

## 2022-01-25 LAB — LIPID PANEL
Chol/HDL Ratio: 4 ratio (ref 0.0–5.0)
Cholesterol, Total: 144 mg/dL (ref 100–199)
HDL: 36 mg/dL — ABNORMAL LOW (ref >39)
LDL Chol Calc (NIH): 88 mg/dL (ref 0–99)
Triglycerides: 109 mg/dL (ref 0–149)
VLDL Cholesterol Cal: 20 mg/dL (ref 5–40)

## 2022-01-25 LAB — VITAMIN D 25 HYDROXY (VIT D DEFICIENCY, FRACTURES): Vit D, 25-Hydroxy: 24.8 ng/mL — ABNORMAL LOW (ref 30.0–100.0)

## 2022-01-25 NOTE — Progress Notes (Signed)
Dear Calton Dach, Your Vitamin D is  low. You need a prescription strength supplement I will send that in for you. Nurse, if at all possible, could you send in a prescription for the patient for vitamin D 50,000 units, 1 p.o. weekly #13 with 3 refills? Many thanks, WS

## 2022-01-26 ENCOUNTER — Other Ambulatory Visit: Payer: Self-pay | Admitting: Emergency Medicine

## 2022-01-26 MED ORDER — VITAMIN D (ERGOCALCIFEROL) 1.25 MG (50000 UNIT) PO CAPS: 50000.0000 [IU] | ORAL_CAPSULE | ORAL | 1 refills | Status: DC

## 2022-05-02 ENCOUNTER — Other Ambulatory Visit: Payer: Self-pay | Admitting: Family Medicine

## 2022-06-06 ENCOUNTER — Other Ambulatory Visit: Payer: Self-pay | Admitting: Family Medicine

## 2022-06-06 DIAGNOSIS — I10 Essential (primary) hypertension: Secondary | ICD-10-CM

## 2022-08-10 ENCOUNTER — Other Ambulatory Visit: Payer: Self-pay | Admitting: Family Medicine

## 2022-09-20 ENCOUNTER — Other Ambulatory Visit: Payer: Self-pay | Admitting: Family Medicine

## 2022-09-20 DIAGNOSIS — I10 Essential (primary) hypertension: Secondary | ICD-10-CM

## 2023-01-26 ENCOUNTER — Encounter: Payer: PRIVATE HEALTH INSURANCE | Admitting: Family Medicine

## 2023-03-14 ENCOUNTER — Other Ambulatory Visit: Payer: Self-pay | Admitting: Family Medicine

## 2023-03-28 ENCOUNTER — Other Ambulatory Visit: Payer: Self-pay | Admitting: *Deleted

## 2023-03-28 ENCOUNTER — Telehealth: Payer: Self-pay | Admitting: Family Medicine

## 2023-03-28 DIAGNOSIS — I10 Essential (primary) hypertension: Secondary | ICD-10-CM

## 2023-03-28 MED ORDER — AMLODIPINE BESY-BENAZEPRIL HCL 10-40 MG PO CAPS: 1.0000 | ORAL_CAPSULE | Freq: Every day | ORAL | 0 refills | Status: DC

## 2023-03-28 NOTE — Telephone Encounter (Signed)
  Prescription Request  03/28/2023  Is this a "Controlled Substance" medicine? no  Have you seen your PCP in the last 2 weeks? No pt has appt on 04/13/23  If YES, route message to pool  -  If NO, patient needs to be scheduled for appointment.  What is the name of the medication or equipment? amLODipine-benazepril (LOTREL) 10-40 MG capsule   Have you contacted your pharmacy to request a refill? yes   Which pharmacy would you like this sent to?  [facility] - [place], [place] - [facility] [address] [place] [address]    Patient notified that their request is being sent to the clinical staff for review and that they should receive a response within 2 business days.

## 2023-03-28 NOTE — Telephone Encounter (Signed)
One month refill sent.

## 2023-04-13 ENCOUNTER — Encounter: Payer: Self-pay | Admitting: Family Medicine

## 2023-04-13 ENCOUNTER — Ambulatory Visit: Payer: PRIVATE HEALTH INSURANCE | Admitting: Family Medicine

## 2023-04-13 VITALS — BP 138/84 | HR 55 | Temp 98.3°F | Resp 20 | Ht 73.0 in | Wt 340.4 lb

## 2023-04-13 DIAGNOSIS — Z125 Encounter for screening for malignant neoplasm of prostate: Secondary | ICD-10-CM

## 2023-04-13 DIAGNOSIS — Z1211 Encounter for screening for malignant neoplasm of colon: Secondary | ICD-10-CM

## 2023-04-13 DIAGNOSIS — I10 Essential (primary) hypertension: Secondary | ICD-10-CM

## 2023-04-13 DIAGNOSIS — Z0001 Encounter for general adult medical examination with abnormal findings: Secondary | ICD-10-CM | POA: Diagnosis not present

## 2023-04-13 DIAGNOSIS — Z114 Encounter for screening for human immunodeficiency virus [HIV]: Secondary | ICD-10-CM

## 2023-04-13 DIAGNOSIS — Z1159 Encounter for screening for other viral diseases: Secondary | ICD-10-CM

## 2023-04-13 DIAGNOSIS — Z0000 Encounter for general adult medical examination without abnormal findings: Secondary | ICD-10-CM

## 2023-04-13 NOTE — Progress Notes (Signed)
Subjective:  Patient ID: Donald Eaton, male    DOB: [birth year]  Age: 47 y.o. MRN: [id number]  CC: Annual Exam   HPI Donald Eaton presents for CPE     04/13/2023   10:04 AM 01/24/2022   10:57 AM 06/14/2021    4:44 PM  Depression screen PHQ 2/9  Decreased Interest 0 0 0  Down, Depressed, Hopeless 0 0 0  PHQ - 2 Score 0 0 0  Altered sleeping 0  0  Tired, decreased energy 0  0  Change in appetite 0  1  Feeling bad or failure about yourself  0  0  Trouble concentrating 0  0  Moving slowly or fidgety/restless 0  0  Suicidal thoughts 0  0  PHQ-9 Score 0  1  Difficult doing work/chores   Not difficult at all    History Donald Eaton has a past medical history of Hypertension and Obesity.   Donald Eaton has a past surgical history that includes none.   His family history includes Heart disease in his father.Donald Eaton reports that Donald Eaton has never smoked. Donald Eaton has never used smokeless tobacco. Donald Eaton reports that Donald Eaton does not drink alcohol and does not use drugs.    ROS Review of Systems  Constitutional:  Negative for activity change, fatigue and unexpected weight change.  HENT:  Negative for congestion, ear pain, hearing loss, postnasal drip and trouble swallowing.   Eyes:  Negative for pain and visual disturbance.  Respiratory:  Negative for cough, chest tightness and shortness of breath.   Cardiovascular:  Negative for chest pain, palpitations and leg swelling.  Gastrointestinal:  Negative for abdominal distention, abdominal pain, blood in stool, constipation, diarrhea, nausea and vomiting.  Endocrine: Negative for cold intolerance, heat intolerance and polydipsia.  Genitourinary:  Negative for difficulty urinating, dysuria, flank pain, frequency and urgency.  Musculoskeletal:  Negative for arthralgias and joint swelling.  Skin:  Negative for color change, rash and wound.  Neurological:  Negative for dizziness, syncope, speech difficulty, weakness, light-headedness, numbness and headaches.   Hematological:  Does not bruise/bleed easily.  Psychiatric/Behavioral:  Negative for confusion, decreased concentration, dysphoric mood and sleep disturbance. The patient is not nervous/anxious.     Objective:  BP 138/84   Pulse (!) 55   Temp 98.3 F (36.8 C) (Oral)   Resp 20   Ht 6\' 1"  (1.854 m)   Wt (!) 340 lb 6 oz (154.4 kg)   SpO2 96%   BMI 44.91 kg/m   BP Readings from Last 3 Encounters:  04/13/23 138/84  01/24/22 132/82  06/14/21 (!) 161/87    Wt Readings from Last 3 Encounters:  04/13/23 (!) 340 lb 6 oz (154.4 kg)  01/24/22 (!) 333 lb 3.2 oz (151.1 kg)  06/14/21 (!) 343 lb (155.6 kg)     Physical Exam Constitutional:      Appearance: Donald Eaton is well-developed. Donald Eaton is obese.  HENT:     Head: Normocephalic and atraumatic.  Eyes:     Pupils: Pupils are equal, round, and reactive to light.  Neck:     Thyroid: No thyromegaly.     Trachea: No tracheal deviation.  Cardiovascular:     Rate and Rhythm: Normal rate and regular rhythm.     Heart sounds: Normal heart sounds. No murmur heard.    No friction rub. No gallop.  Pulmonary:     Breath sounds: Normal breath sounds. No wheezing or rales.  Abdominal:     General: Bowel sounds are normal. There is no distension.  Palpations: Abdomen is soft. There is no mass.     Tenderness: There is no abdominal tenderness.     Hernia: There is no hernia in the left inguinal area.  Genitourinary:    Penis: Normal.      Testes: Normal.  Musculoskeletal:        General: Normal range of motion.     Cervical back: Normal range of motion.  Lymphadenopathy:     Cervical: No cervical adenopathy.  Skin:    General: Skin is warm and dry.  Neurological:     Mental Status: Donald Eaton is alert and oriented to person, place, and time.       Assessment & Plan:   Donald Eaton was seen today for annual exam.  Diagnoses and all orders for this visit:  Need for hepatitis C screening test -     Hepatitis C antibody  Screen for colon  cancer -     Ambulatory referral to Gastroenterology  Screening for prostate cancer -     PSA, total and free  Primary hypertension -     CBC with Differential/Platelet -     CMP14+EGFR  Morbid obesity (HCC) -     CBC with Differential/Platelet -     CMP14+EGFR  Encounter for screening for HIV -     HIV Antibody (routine testing w rflx)  Well adult exam -     CBC with Differential/Platelet -     CMP14+EGFR -     Ambulatory referral to Gastroenterology -     PSA, total and free -     Hepatitis C antibody -     HIV Antibody (routine testing w rflx)       I have discontinued Casimiro Needle Dogan's scopolamine. I am also having him maintain his Vitamin D (Ergocalciferol), rosuvastatin, chlorthalidone, and amLODipine-benazepril.  Allergies as of 04/13/2023       Reactions   Niaspan [niacin Er] Rash        Medication List        Accurate as of April 13, 2023 11:59 PM. If you have any questions, ask your nurse or doctor.          STOP taking these medications    scopolamine 1 MG/3DAYS Commonly known as: Transderm-Scop Stopped by: Mylee Falin       TAKE these medications    amLODipine-benazepril 10-40 MG capsule Commonly known as: LOTREL Take 1 capsule by mouth daily.   chlorthalidone 25 MG tablet Commonly known as: HYGROTON Take 1 tablet (25 mg total) by mouth daily.   rosuvastatin 10 MG tablet Commonly known as: CRESTOR Take 1 tablet (10 mg total) by mouth daily. For cholesterol   Vitamin D (Ergocalciferol) 1.25 MG (50000 UNIT) Caps capsule Commonly known as: DRISDOL Take 1 capsule (50,000 Units total) by mouth every 7 (seven) days.         Follow-up: Return in about 6 months (around 10/14/2023) for hypertension, cholesterol.  Mechele Claude, M.D.

## 2023-04-14 LAB — CBC WITH DIFFERENTIAL/PLATELET
Basophils Absolute: 0 10*3/uL (ref 0.0–0.2)
Basos: 1 %
EOS (ABSOLUTE): 0.1 10*3/uL (ref 0.0–0.4)
Eos: 1 %
Hematocrit: 46.2 % (ref 37.5–51.0)
Hemoglobin: 15.6 g/dL (ref 13.0–17.7)
Immature Grans (Abs): 0 10*3/uL (ref 0.0–0.1)
Immature Granulocytes: 0 %
Lymphocytes Absolute: 1.7 10*3/uL (ref 0.7–3.1)
Lymphs: 37 %
MCH: 29.8 pg (ref 26.6–33.0)
MCHC: 33.8 g/dL (ref 31.5–35.7)
MCV: 88 fL (ref 79–97)
Monocytes Absolute: 0.3 10*3/uL (ref 0.1–0.9)
Monocytes: 7 %
Neutrophils Absolute: 2.4 10*3/uL (ref 1.4–7.0)
Neutrophils: 54 %
Platelets: 184 10*3/uL (ref 150–450)
RBC: 5.23 x10E6/uL (ref 4.14–5.80)
RDW: 12.3 % (ref 11.6–15.4)
WBC: 4.5 10*3/uL (ref 3.4–10.8)

## 2023-04-14 LAB — CMP14+EGFR
ALT: 34 IU/L (ref 0–44)
AST: 34 IU/L (ref 0–40)
Albumin: 4.5 g/dL (ref 4.1–5.1)
Alkaline Phosphatase: 58 IU/L (ref 44–121)
BUN/Creatinine Ratio: 14 (ref 9–20)
BUN: 20 mg/dL (ref 6–24)
Bilirubin Total: 1 mg/dL (ref 0.0–1.2)
CO2: 23 mmol/L (ref 20–29)
Calcium: 9.5 mg/dL (ref 8.7–10.2)
Chloride: 103 mmol/L (ref 96–106)
Creatinine, Ser: 1.4 mg/dL — ABNORMAL HIGH (ref 0.76–1.27)
Globulin, Total: 2.3 g/dL (ref 1.5–4.5)
Glucose: 99 mg/dL (ref 70–99)
Potassium: 4.2 mmol/L (ref 3.5–5.2)
Sodium: 138 mmol/L (ref 134–144)
Total Protein: 6.8 g/dL (ref 6.0–8.5)
eGFR: 62 mL/min/{1.73_m2} (ref >59)

## 2023-04-14 LAB — PSA, TOTAL AND FREE
PSA, Free Pct: 50 %
PSA, Free: 0.15 ng/mL
Prostate Specific Ag, Serum: 0.3 ng/mL (ref 0.0–4.0)

## 2023-04-14 LAB — HEPATITIS C ANTIBODY: Hep C Virus Ab: NONREACTIVE

## 2023-04-14 LAB — HIV ANTIBODY (ROUTINE TESTING W REFLEX): HIV Screen 4th Generation wRfx: NONREACTIVE

## 2023-04-17 NOTE — Progress Notes (Signed)
Hello Amadou,  Your lab result is normal and/or stable.Some minor variations that are not significant are commonly marked abnormal, but do not represent any medical problem for you.  Best regards, Warren Stacks, M.D.

## 2023-05-01 ENCOUNTER — Other Ambulatory Visit: Payer: Self-pay | Admitting: Family Medicine

## 2023-05-01 DIAGNOSIS — I10 Essential (primary) hypertension: Secondary | ICD-10-CM

## 2023-05-01 MED ORDER — AMLODIPINE BESY-BENAZEPRIL HCL 10-40 MG PO CAPS
1.0000 | ORAL_CAPSULE | Freq: Every day | ORAL | 0 refills | Status: DC
Start: 2023-05-01 — End: 2023-05-11

## 2023-05-11 ENCOUNTER — Telehealth: Payer: Self-pay | Admitting: Family Medicine

## 2023-05-11 DIAGNOSIS — I10 Essential (primary) hypertension: Secondary | ICD-10-CM

## 2023-05-11 MED ORDER — AMLODIPINE BESY-BENAZEPRIL HCL 10-40 MG PO CAPS
1.0000 | ORAL_CAPSULE | Freq: Every day | ORAL | 0 refills | Status: DC
Start: 2023-05-11 — End: 2023-06-01

## 2023-05-11 MED ORDER — ROSUVASTATIN CALCIUM 10 MG PO TABS: 10.0000 mg | ORAL_TABLET | Freq: Every day | ORAL | 3 refills | Status: DC

## 2023-05-11 MED ORDER — CHLORTHALIDONE 25 MG PO TABS: 25.0000 mg | ORAL_TABLET | Freq: Every day | ORAL | 0 refills | Status: DC

## 2023-05-11 NOTE — Telephone Encounter (Signed)
  Prescription Request  05/11/2023  Is this a "Controlled Substance" medicine? NO  Have you seen your PCP in the last 2 weeks? PT LAST SEEN 08/22 next appt 09/2023  If YES, route message to pool  -  If NO, patient needs to be scheduled for appointment.  What is the name of the medication or equipment? rosuvastatin (CRESTOR) 10 MG tablet  amLODipine-benazepril (LOTREL) 10-40 MG capsule  Vitamin D, Ergocalciferol, (DRISDOL) 1.25 MG (50000 UNIT) CAPS capsule  chlorthalidone (HYGROTON) 25 MG tablet   Have you contacted your pharmacy to request a refill? yes   Which pharmacy would you like this sent to? [facility] - [place], [facility] - [facility] [address] [place] [address] Phone: [phone]  Fax: [phone]    Patient notified that their request is being sent to the clinical staff for review and that they should receive a response within 2 business days.

## 2023-05-11 NOTE — Telephone Encounter (Signed)
Okay to send vit D scrip refills

## 2023-05-11 NOTE — Telephone Encounter (Signed)
Ok to fill Vit D? Last vit D level checked 01/24/2022? Other refills sent in.

## 2023-05-12 MED ORDER — VITAMIN D (ERGOCALCIFEROL) 1.25 MG (50000 UNIT) PO CAPS: 50000.0000 [IU] | ORAL_CAPSULE | ORAL | 1 refills | Status: DC

## 2023-05-12 NOTE — Telephone Encounter (Signed)
Left message advising refills sent in as requested and to call back with any further questions or concerns.

## 2023-05-30 ENCOUNTER — Other Ambulatory Visit: Payer: Self-pay | Admitting: Family Medicine

## 2023-05-30 DIAGNOSIS — I10 Essential (primary) hypertension: Secondary | ICD-10-CM

## 2023-06-01 ENCOUNTER — Other Ambulatory Visit: Payer: Self-pay | Admitting: Family Medicine

## 2023-06-01 DIAGNOSIS — I10 Essential (primary) hypertension: Secondary | ICD-10-CM

## 2023-06-01 MED ORDER — AMLODIPINE BESY-BENAZEPRIL HCL 10-40 MG PO CAPS
1.0000 | ORAL_CAPSULE | Freq: Every day | ORAL | 0 refills | Status: DC
Start: 2023-06-01 — End: 2023-09-05

## 2023-09-04 ENCOUNTER — Other Ambulatory Visit: Payer: Self-pay | Admitting: Family Medicine

## 2023-09-04 DIAGNOSIS — I10 Essential (primary) hypertension: Secondary | ICD-10-CM

## 2023-10-09 ENCOUNTER — Other Ambulatory Visit: Payer: Self-pay | Admitting: Family Medicine

## 2023-10-16 ENCOUNTER — Ambulatory Visit: Payer: PRIVATE HEALTH INSURANCE | Admitting: Family Medicine

## 2023-12-06 ENCOUNTER — Ambulatory Visit: Payer: PRIVATE HEALTH INSURANCE | Admitting: Family Medicine

## 2023-12-06 ENCOUNTER — Other Ambulatory Visit: Payer: Self-pay | Admitting: Family Medicine

## 2023-12-06 DIAGNOSIS — I10 Essential (primary) hypertension: Secondary | ICD-10-CM

## 2023-12-13 ENCOUNTER — Encounter: Payer: Self-pay | Admitting: Family Medicine

## 2023-12-13 ENCOUNTER — Ambulatory Visit (INDEPENDENT_AMBULATORY_CARE_PROVIDER_SITE_OTHER): Payer: PRIVATE HEALTH INSURANCE | Admitting: Family Medicine

## 2023-12-13 VITALS — BP 116/76 | HR 66 | Temp 98.2°F | Ht 73.0 in | Wt 347.0 lb

## 2023-12-13 DIAGNOSIS — Z23 Encounter for immunization: Secondary | ICD-10-CM | POA: Diagnosis not present

## 2023-12-13 DIAGNOSIS — E559 Vitamin D deficiency, unspecified: Secondary | ICD-10-CM | POA: Insufficient documentation

## 2023-12-13 DIAGNOSIS — E785 Hyperlipidemia, unspecified: Secondary | ICD-10-CM

## 2023-12-13 DIAGNOSIS — Z1211 Encounter for screening for malignant neoplasm of colon: Secondary | ICD-10-CM

## 2023-12-13 DIAGNOSIS — Z6841 Body Mass Index (BMI) 40.0 and over, adult: Secondary | ICD-10-CM

## 2023-12-13 DIAGNOSIS — I10 Essential (primary) hypertension: Secondary | ICD-10-CM

## 2023-12-13 LAB — LIPID PANEL

## 2023-12-13 MED ORDER — CHLORTHALIDONE 25 MG PO TABS: 25.0000 mg | ORAL_TABLET | Freq: Every day | ORAL | 0 refills | Status: DC

## 2023-12-13 MED ORDER — AMLODIPINE BESY-BENAZEPRIL HCL 10-40 MG PO CAPS
1.0000 | ORAL_CAPSULE | Freq: Every day | ORAL | 0 refills | Status: DC
Start: 2023-12-13 — End: 2023-12-13

## 2023-12-13 MED ORDER — CHLORTHALIDONE 25 MG PO TABS: 25.0000 mg | ORAL_TABLET | Freq: Every day | ORAL | 3 refills | Status: AC

## 2023-12-13 MED ORDER — AMLODIPINE BESY-BENAZEPRIL HCL 10-40 MG PO CAPS: 1.0000 | ORAL_CAPSULE | Freq: Every day | ORAL | 3 refills | Status: AC

## 2023-12-13 NOTE — Progress Notes (Signed)
 Subjective:  Patient ID: Donald Eaton, male    DOB: August 21, 1976  Age: 48 y.o. MRN: [id number]  CC: Follow-up   HPI Donald Eaton presents for  follow-up of hypertension. Patient has no history of headache chest pain or shortness of breath or recent cough. Patient also denies symptoms of TIA such as focal numbness or weakness. Patient denies side effects from medication. States taking it regularly.   in for follow-up of elevated cholesterol. Doing well without complaints on current medication. Denies side effects of statin including myalgia and arthralgia and nausea. Currently no chest pain, shortness of breath or other cardiovascular related symptoms noted.  Due for recheck of vitamin D  deficiency. Off supplement currently.   History Donald Eaton has a past medical history of Hypertension and Obesity.   He has a past surgical history that includes none.   His family history includes Heart disease in his father.He reports that he has never smoked. He has never used smokeless tobacco. He reports that he does not drink alcohol and does not use drugs.  Current Outpatient Medications on File Prior to Visit  Medication Sig Dispense Refill   rosuvastatin  (CRESTOR ) 10 MG tablet Take 1 tablet (10 mg total) by mouth daily. For cholesterol 90 tablet 3   Vitamin D , Ergocalciferol , (DRISDOL ) 1.25 MG (50000 UNIT) CAPS capsule Take 1 capsule (50,000 Units total) by mouth every 7 (seven) days. 13 capsule 1   No current facility-administered medications on file prior to visit.    ROS Review of Systems  Constitutional:  Negative for fever.  Respiratory:  Negative for shortness of breath.   Cardiovascular:  Negative for chest pain.  Musculoskeletal:  Negative for arthralgias.  Skin:  Negative for rash.    Objective:  BP 116/76   Pulse 66   Temp 98.2 F (36.8 C)   Ht 6\' 1"  (1.854 m)   Wt (!) 347 lb (157.4 kg)   SpO2 97%   BMI 45.78 kg/m   BP Readings from Last 3 Encounters:  12/13/23  116/76  04/13/23 138/84  01/24/22 132/82    Wt Readings from Last 3 Encounters:  12/13/23 (!) 347 lb (157.4 kg)  04/13/23 (!) 340 lb 6 oz (154.4 kg)  01/24/22 (!) 333 lb 3.2 oz (151.1 kg)     Physical Exam Vitals reviewed.  Constitutional:      Appearance: He is well-developed.  HENT:     Head: Normocephalic and atraumatic.     Right Ear: External ear normal.     Left Ear: External ear normal.     Mouth/Throat:     Pharynx: No oropharyngeal exudate or posterior oropharyngeal erythema.  Eyes:     Pupils: Pupils are equal, round, and reactive to light.  Cardiovascular:     Rate and Rhythm: Normal rate and regular rhythm.     Heart sounds: No murmur heard. Pulmonary:     Effort: No respiratory distress.     Breath sounds: Normal breath sounds.  Musculoskeletal:     Cervical back: Normal range of motion and neck supple.  Neurological:     Mental Status: He is alert and oriented to person, place, and time.       Assessment & Plan:  Screen for colon cancer -     Ambulatory referral to Gastroenterology -     CBC with Differential/Platelet -     CMP14+EGFR  Essential hypertension, benign -     amLODIPine  Besy-Benazepril  HCl; Take 1 capsule by mouth daily.  Dispense: 90 capsule;  Refill: 3 -     CBC with Differential/Platelet -     CMP14+EGFR  Hyperlipidemia, unspecified hyperlipidemia type -     CBC with Differential/Platelet -     CMP14+EGFR -     Lipid panel  Morbid obesity (HCC) -     CBC with Differential/Platelet -     CMP14+EGFR  Vitamin D  deficiency -     CBC with Differential/Platelet -     CMP14+EGFR -     VITAMIN D  25 Hydroxy (Vit-D Deficiency, Fractures)  Immunization due -     Tdap vaccine greater than or equal to 7yo IM  Other orders -     Chlorthalidone ; Take 1 tablet (25 mg total) by mouth daily.  Dispense: 90 tablet; Refill: 3    Allergies as of 12/13/2023       Reactions   Niaspan [niacin Er (antihyperlipidemic)] Rash         Medication List        Accurate as of December 13, 2023  5:43 PM. If you have any questions, ask your nurse or doctor.          amLODipine -benazepril  10-40 MG capsule Commonly known as: LOTREL Take 1 capsule by mouth daily.   chlorthalidone  25 MG tablet Commonly known as: HYGROTON  Take 1 tablet (25 mg total) by mouth daily.   rosuvastatin  10 MG tablet Commonly known as: CRESTOR  Take 1 tablet (10 mg total) by mouth daily. For cholesterol   Vitamin D  (Ergocalciferol ) 1.25 MG (50000 UNIT) Caps capsule Commonly known as: DRISDOL  Take 1 capsule (50,000 Units total) by mouth every 7 (seven) days.         Follow-up: Return in about 1 year (around 12/12/2024) for Compete physical.  Roise Cleaver, M.D.

## 2023-12-14 LAB — CBC WITH DIFFERENTIAL/PLATELET
Basophils Absolute: 0 10*3/uL (ref 0.0–0.2)
Basos: 1 %
EOS (ABSOLUTE): 0.1 10*3/uL (ref 0.0–0.4)
Eos: 2 %
Hematocrit: 50.2 % (ref 37.5–51.0)
Hemoglobin: 16.5 g/dL (ref 13.0–17.7)
Immature Grans (Abs): 0 10*3/uL (ref 0.0–0.1)
Immature Granulocytes: 0 %
Lymphocytes Absolute: 1.6 10*3/uL (ref 0.7–3.1)
Lymphs: 39 %
MCH: 29.7 pg (ref 26.6–33.0)
MCHC: 32.9 g/dL (ref 31.5–35.7)
MCV: 91 fL (ref 79–97)
Monocytes Absolute: 0.4 10*3/uL (ref 0.1–0.9)
Monocytes: 9 %
Neutrophils Absolute: 2 10*3/uL (ref 1.4–7.0)
Neutrophils: 49 %
Platelets: 176 10*3/uL (ref 150–450)
RBC: 5.55 x10E6/uL (ref 4.14–5.80)
RDW: 12.7 % (ref 11.6–15.4)
WBC: 4.1 10*3/uL (ref 3.4–10.8)

## 2023-12-14 LAB — LIPID PANEL
Cholesterol, Total: 150 mg/dL (ref 100–199)
HDL: 37 mg/dL — ABNORMAL LOW (ref >39)
LDL CALC COMMENT:: 4.1 ratio (ref 0.0–5.0)
LDL Chol Calc (NIH): 87 mg/dL (ref 0–99)
Triglycerides: 147 mg/dL (ref 0–149)
VLDL Cholesterol Cal: 26 mg/dL (ref 5–40)

## 2023-12-14 LAB — CMP14+EGFR
ALT: 30 IU/L (ref 0–44)
AST: 27 IU/L (ref 0–40)
Albumin: 4.6 g/dL (ref 4.1–5.1)
Alkaline Phosphatase: 58 IU/L (ref 44–121)
BUN/Creatinine Ratio: 11 (ref 9–20)
BUN: 14 mg/dL (ref 6–24)
Bilirubin Total: 1 mg/dL (ref 0.0–1.2)
CO2: 23 mmol/L (ref 20–29)
Calcium: 9.7 mg/dL (ref 8.7–10.2)
Chloride: 103 mmol/L (ref 96–106)
Creatinine, Ser: 1.33 mg/dL — ABNORMAL HIGH (ref 0.76–1.27)
Globulin, Total: 2.3 g/dL (ref 1.5–4.5)
Glucose: 96 mg/dL (ref 70–99)
Potassium: 4.8 mmol/L (ref 3.5–5.2)
Sodium: 140 mmol/L (ref 134–144)
Total Protein: 6.9 g/dL (ref 6.0–8.5)
eGFR: 66 mL/min/{1.73_m2} (ref >59)

## 2023-12-14 LAB — VITAMIN D 25 HYDROXY (VIT D DEFICIENCY, FRACTURES): Vit D, 25-Hydroxy: 24.3 ng/mL — ABNORMAL LOW (ref 30.0–100.0)

## 2023-12-17 ENCOUNTER — Encounter: Payer: Self-pay | Admitting: Family Medicine

## 2023-12-17 NOTE — Progress Notes (Signed)
Dear Calton Dach, Your Vitamin D is  low. You need a prescription strength supplement I will send that in for you. Nurse, if at all possible, could you send in a prescription for the patient for vitamin D 50,000 units, 1 p.o. weekly #13 with 3 refills? Many thanks, WS

## 2023-12-25 ENCOUNTER — Other Ambulatory Visit: Payer: Self-pay

## 2023-12-25 MED ORDER — VITAMIN D (ERGOCALCIFEROL) 1.25 MG (50000 UNIT) PO CAPS
50000.0000 [IU] | ORAL_CAPSULE | ORAL | 3 refills | Status: AC
Start: 2023-12-25 — End: ?

## 2024-02-28 ENCOUNTER — Other Ambulatory Visit: Payer: Self-pay | Admitting: Family Medicine

## 2024-02-28 DIAGNOSIS — I10 Essential (primary) hypertension: Secondary | ICD-10-CM

## 2024-05-13 ENCOUNTER — Other Ambulatory Visit: Payer: Self-pay | Admitting: Family Medicine

## 2024-12-18 ENCOUNTER — Encounter: Payer: PRIVATE HEALTH INSURANCE | Admitting: Family Medicine
